# Patient Record
Sex: Female | Born: 1945 | Race: White | Hispanic: No | Marital: Married | Smoking: Former smoker
Health system: Southern US, Community
[De-identification: ages and names within clinical notes are randomized; demographics above are authoritative.]

## PROBLEM LIST (undated history)

## (undated) DIAGNOSIS — F329 Major depressive disorder, single episode, unspecified: Secondary | ICD-10-CM

## (undated) DIAGNOSIS — F32A Depression, unspecified: Secondary | ICD-10-CM

## (undated) DIAGNOSIS — E0590 Thyrotoxicosis, unspecified without thyrotoxic crisis or storm: Secondary | ICD-10-CM

## (undated) DIAGNOSIS — I10 Essential (primary) hypertension: Secondary | ICD-10-CM

## (undated) DIAGNOSIS — M1990 Unspecified osteoarthritis, unspecified site: Secondary | ICD-10-CM

## (undated) DIAGNOSIS — R51 Headache: Secondary | ICD-10-CM

## (undated) DIAGNOSIS — Z8719 Personal history of other diseases of the digestive system: Secondary | ICD-10-CM

## (undated) DIAGNOSIS — K219 Gastro-esophageal reflux disease without esophagitis: Secondary | ICD-10-CM

## (undated) DIAGNOSIS — R519 Headache, unspecified: Secondary | ICD-10-CM

## (undated) DIAGNOSIS — D649 Anemia, unspecified: Secondary | ICD-10-CM

## (undated) HISTORY — PX: BREAST SURGERY: SHX581

## (undated) HISTORY — PX: FOOT SURGERY: SHX648

## (undated) HISTORY — PX: ABDOMINAL HYSTERECTOMY: SHX81

## (undated) HISTORY — PX: OTHER SURGICAL HISTORY: SHX169

## (undated) HISTORY — PX: JOINT REPLACEMENT: SHX530

## (undated) HISTORY — PX: BACK SURGERY: SHX140

---

## 2015-01-20 ENCOUNTER — Other Ambulatory Visit: Payer: Self-pay | Admitting: Internal Medicine

## 2015-01-20 DIAGNOSIS — Z1231 Encounter for screening mammogram for malignant neoplasm of breast: Secondary | ICD-10-CM

## 2015-02-05 ENCOUNTER — Ambulatory Visit: Payer: Self-pay

## 2015-02-17 ENCOUNTER — Ambulatory Visit: Payer: Self-pay

## 2015-03-08 ENCOUNTER — Ambulatory Visit: Payer: Self-pay

## 2015-03-16 ENCOUNTER — Ambulatory Visit: Payer: Self-pay

## 2015-05-25 ENCOUNTER — Other Ambulatory Visit: Payer: Self-pay | Admitting: Pain Medicine

## 2015-05-25 DIAGNOSIS — M545 Low back pain: Secondary | ICD-10-CM

## 2015-08-13 ENCOUNTER — Ambulatory Visit
Admission: RE | Admit: 2015-08-13 | Discharge: 2015-08-13 | Disposition: A | Discharge status: Home / Self Care | Payer: Medicare Other | Source: Ambulatory Visit | Attending: Anesthesiology | Admitting: Anesthesiology

## 2015-08-13 ENCOUNTER — Other Ambulatory Visit: Payer: Self-pay | Admitting: Anesthesiology

## 2015-08-13 DIAGNOSIS — M5489 Other dorsalgia: Secondary | ICD-10-CM

## 2016-01-25 ENCOUNTER — Encounter (HOSPITAL_COMMUNITY): Payer: Self-pay | Admitting: Emergency Medicine

## 2016-01-25 ENCOUNTER — Emergency Department (HOSPITAL_COMMUNITY)
Admission: EM | Admit: 2016-01-25 | Discharge: 2016-01-25 | Disposition: A | Discharge status: Home / Self Care | Payer: Medicare Other | Attending: Emergency Medicine | Admitting: Emergency Medicine

## 2016-01-25 DIAGNOSIS — R55 Syncope and collapse: Secondary | ICD-10-CM | POA: Diagnosis not present

## 2016-01-25 DIAGNOSIS — R61 Generalized hyperhidrosis: Secondary | ICD-10-CM | POA: Diagnosis not present

## 2016-01-25 DIAGNOSIS — F17210 Nicotine dependence, cigarettes, uncomplicated: Secondary | ICD-10-CM | POA: Insufficient documentation

## 2016-01-25 DIAGNOSIS — R51 Headache: Secondary | ICD-10-CM | POA: Insufficient documentation

## 2016-01-25 DIAGNOSIS — K5900 Constipation, unspecified: Secondary | ICD-10-CM | POA: Insufficient documentation

## 2016-01-25 DIAGNOSIS — D649 Anemia, unspecified: Secondary | ICD-10-CM | POA: Insufficient documentation

## 2016-01-25 DIAGNOSIS — Z880 Allergy status to penicillin: Secondary | ICD-10-CM | POA: Diagnosis not present

## 2016-01-25 DIAGNOSIS — R42 Dizziness and giddiness: Secondary | ICD-10-CM | POA: Diagnosis present

## 2016-01-25 DIAGNOSIS — R0981 Nasal congestion: Secondary | ICD-10-CM | POA: Insufficient documentation

## 2016-01-25 DIAGNOSIS — R000 Tachycardia, unspecified: Secondary | ICD-10-CM | POA: Diagnosis not present

## 2016-01-25 DIAGNOSIS — I10 Essential (primary) hypertension: Secondary | ICD-10-CM | POA: Insufficient documentation

## 2016-01-25 DIAGNOSIS — G8929 Other chronic pain: Secondary | ICD-10-CM | POA: Insufficient documentation

## 2016-01-25 HISTORY — DX: Essential (primary) hypertension: I10

## 2016-01-25 LAB — BASIC METABOLIC PANEL
ANION GAP: 12 (ref 5–15)
BUN: 34 mg/dL — AB (ref 6–20)
CALCIUM: 9.1 mg/dL (ref 8.9–10.3)
CO2: 23 mmol/L (ref 22–32)
Chloride: 106 mmol/L (ref 101–111)
Creatinine, Ser: 0.81 mg/dL (ref 0.44–1.00)
GFR calc Af Amer: >60 mL/min (ref >60)
Glucose, Bld: 103 mg/dL — ABNORMAL HIGH (ref 65–99)
POTASSIUM: 4.8 mmol/L (ref 3.5–5.1)
SODIUM: 141 mmol/L (ref 135–145)

## 2016-01-25 LAB — CBC WITH DIFFERENTIAL/PLATELET
BASOS ABS: 0.1 10*3/uL (ref 0.0–0.1)
BASOS PCT: 1 %
EOS ABS: 0.1 10*3/uL (ref 0.0–0.7)
EOS PCT: 1 %
HCT: 32 % — ABNORMAL LOW (ref 36.0–46.0)
Hemoglobin: 10.3 g/dL — ABNORMAL LOW (ref 12.0–15.0)
LYMPHS PCT: 7 %
Lymphs Abs: 1.1 10*3/uL (ref 0.7–4.0)
MCH: 32 pg (ref 26.0–34.0)
MCHC: 32.2 g/dL (ref 30.0–36.0)
MCV: 99.4 fL (ref 78.0–100.0)
Monocytes Absolute: 1.1 10*3/uL — ABNORMAL HIGH (ref 0.1–1.0)
Monocytes Relative: 7 %
Neutro Abs: 12.7 10*3/uL — ABNORMAL HIGH (ref 1.7–7.7)
Neutrophils Relative %: 84 %
PLATELETS: 320 10*3/uL (ref 150–400)
RBC: 3.22 MIL/uL — AB (ref 3.87–5.11)
RDW: 13.1 % (ref 11.5–15.5)
WBC: 15.1 10*3/uL — AB (ref 4.0–10.5)

## 2016-01-25 MED ORDER — SODIUM CHLORIDE 0.9 % IV BOLUS (SEPSIS)
1000.0000 mL | Freq: Once | INTRAVENOUS | Status: AC
  Administered 2016-01-25: 1000 mL via INTRAVENOUS

## 2016-01-25 MED ORDER — SODIUM CHLORIDE 0.9 % IV BOLUS (SEPSIS): 1000.0000 mL | Freq: Once | INTRAVENOUS | Status: DC

## 2016-01-25 NOTE — ED Notes (Signed)
Pt verbalized understanding of d/c instructions and has no further questions. Pt stable and NAD.  

## 2016-01-25 NOTE — ED Notes (Signed)
Pt given [place] sandwich and water and encouraged to eat/drink.

## 2016-01-25 NOTE — ED Provider Notes (Signed)
CSN: [id number]     Arrival date & time 01/25/16  1454 History   First MD Initiated Contact with Patient 01/25/16 1516     Chief Complaint  Patient presents with  . Dizziness  . Hypotension     (Consider location/radiation/quality/duration/timing/severity/associated sxs/prior Treatment) HPI  70 year old female presents with lightheadedness. EMS reports that she was hypotensive on their arrival with blood pressure in the 90s and was diaphoretic and pale. Given 400 mL IV fluids with good response. Patient states that she was somewhat nauseated last night and then this morning when she woke up at 10 AM she was lightheaded and dizzy. Feeling like she's can a pass out. It was worse whenever she stood up or walked around but still present whenever lying flat, although better. She feels like after the fluids the lightheadedness at rest is much better. She states that for several hours she cannot seem to get rid of the lightheadedness despite drinking water. Patient endorses a history of hypertension and chronic pain treated with opiates. Daily new medicine recently was Ambien which she started last night. Previous to this she had not used Ambien for sleep for over 4 months. Some nausea but no vomiting. Had a headache this morning but states that she typically has headaches in the morning which has been attributed to sinus. Was not worse than typical. Some blurry vision while lightheaded but not now. No chest pain, neck pain, abdominal pain, vomiting, diarrhea, or shortness of breath.  Past Medical History  Diagnosis Date  . Hypertension    History reviewed. No pertinent past surgical history. No family history on file. Social History  Substance Use Topics  . Smoking status: Current Every Day Smoker -- 0.50 packs/day    Types: Cigarettes  . Smokeless tobacco: Current User  . Alcohol Use: 0.6 oz/week    1 Glasses of wine per week     Comment: once/month   OB History    No data available      Review of Systems  Constitutional: Positive for diaphoresis. Negative for fever.  HENT: Positive for congestion (chronic sinus problems) and sinus pressure.   Respiratory: Negative for shortness of breath.   Cardiovascular: Negative for chest pain.  Gastrointestinal: Positive for constipation (chronic). Negative for nausea, vomiting, abdominal pain and diarrhea.  Genitourinary: Negative for dysuria.  Neurological: Positive for light-headedness and headaches (chronic, minimal now). Negative for syncope.  All other systems reviewed and are negative.     Allergies  Ampicillin  Home Medications   Prior to Admission medications   Not on File   BP 151/79 mmHg  Pulse 91  SpO2 98% Physical Exam  Constitutional: She is oriented to person, place, and time. She appears well-developed and well-nourished.  HENT:  Head: Normocephalic and atraumatic.  Right Ear: External ear normal.  Left Ear: External ear normal.  Nose: Nose normal.  Eyes: EOM are normal. Pupils are equal, round, and reactive to light. Right eye exhibits no discharge. Left eye exhibits no discharge.  Neck: Normal range of motion. Neck supple.  No stiffness/meningismus  Cardiovascular: Regular rhythm and normal heart sounds.  Tachycardia present.   Pulmonary/Chest: Effort normal and breath sounds normal. She has no wheezes. She has no rales.  Abdominal: Soft. She exhibits no distension. There is no tenderness.  Musculoskeletal: She exhibits no edema.  Neurological: She is alert and oriented to person, place, and time.  CN 2-12 grossly intact. 5/5 strength in all 4 extremities. Normal finger to nose. Grossly normal sensation  Skin: Skin is warm and dry.  Nursing note and vitals reviewed.   ED Course  Procedures (including critical care time) Labs Review Labs Reviewed  BASIC METABOLIC PANEL - Abnormal; Notable for the following:    Glucose, Bld 103 (*)    BUN 34 (*)    All other components within normal limits   CBC WITH DIFFERENTIAL/PLATELET - Abnormal; Notable for the following:    WBC 15.1 (*)    RBC 3.22 (*)    Hemoglobin 10.3 (*)    HCT 32.0 (*)    Neutro Abs 12.7 (*)    Monocytes Absolute 1.1 (*)    All other components within normal limits    Imaging Review No results found. I have personally reviewed and evaluated these images and lab results as part of my medical decision-making.   EKG Interpretation   Date/Time:  Tuesday January 25 2016 16:26:20 EDT Ventricular Rate:  94 PR Interval:  136 QRS Duration: 83 QT Interval:  342 QTC Calculation: 428 R Axis:   63 Text Interpretation:  Sinus rhythm Normal ECG Artifact No old tracing to  compare Confirmed by Aqua Denslow  MD, Isaih Bulger ([id number]) on 01/25/2016 4:30:45 PM  Also confirmed by Criss Alvine  MD, Sheccid Lahmann ([id number]), editor WALKER, CCT, SANDRA  ([id number])  on 01/25/2016 4:33:43 PM      MDM   Final diagnoses:  Near syncope  Anemia, unspecified anemia type    Unclear exactly why patient is feeling lightheaded. Neuro exam is unremarkable. Possibly related to recent addition of Ambien to her medicines. Could also be from dehydration. She feels much better with IV fluids here. She is anemic with a hemoglobin of 10 but no baseline. She states if she takes Pepto-Bismol for abdominal pain she will have black stools for a couple days but none recently. She declines rectal exam. At this point given that she feels completely normal and her vital signs are unremarkable I think it is reasonable to discharge her. She wants to go home. Increase fluid intake at home and follow-up with PCP for recheck of her hemoglobin. Discussed return precautions.    Pricilla Loveless, MD 01/25/16 [phone]

## 2016-01-25 NOTE — ED Notes (Signed)
[facility] with cc of dizziness and hypotension. Pt reports nausea last night and dizziness that began at 10am today. When EMS arrived, pt was diaphoretic and pale, SBP 90's. EMS gave 400cc's NS bolus, BG 88. Pt awake, a&ox4, denies pain or sob.

## 2016-01-25 NOTE — ED Notes (Addendum)
Pt states "I feel better and pt able to sit up without nausea and dizziness:" VSS. Pt stable and NAD. Pt d/c home with husband driving. Pt verbalized understanding of d/c instructions and has no further questions.

## 2017-01-08 ENCOUNTER — Ambulatory Visit (INDEPENDENT_AMBULATORY_CARE_PROVIDER_SITE_OTHER): Payer: Medicare Other | Admitting: Orthopaedic Surgery

## 2017-01-08 ENCOUNTER — Ambulatory Visit (INDEPENDENT_AMBULATORY_CARE_PROVIDER_SITE_OTHER): Payer: Medicare Other

## 2017-01-08 DIAGNOSIS — M1611 Unilateral primary osteoarthritis, right hip: Secondary | ICD-10-CM | POA: Diagnosis not present

## 2017-01-08 DIAGNOSIS — M25551 Pain in right hip: Secondary | ICD-10-CM

## 2017-01-08 NOTE — Progress Notes (Signed)
Office Visit Note   Patient: Kaitlin Little           Date of Birth: Dec 28, 1945           MRN: [id number] Visit Date: 01/08/2017              Requested by: No referring provider defined for this encounter. PCP: No primary care provider on file.   Assessment & Plan: Visit Diagnoses:  1. Pain in right hip   2. Unilateral primary osteoarthritis, right hip     Plan: Given the severity of her right hip pain as well as her clinical exam and x-ray findings show severe osteoarthritis and generally disease we are recommending a right total hip replacement through direct anterior approach. She is familiar with this having had this done her left side before. She understands the risk of acute blood loss anemia, nerve and vessel injury, fracture, infection, dislocation and DVT. She understands our goals are decreased pain improve mobility and overall poor quality of life. We had a long and thorough discussion about the surgery and she does wish to proceed in the near future. We see her back in 2 weeks postoperative no x-rays of be needed. All questions were encouraged and answered area  Follow-Up Instructions: Return for 2 weeks post-op.   Orders:  Orders Placed This Encounter  Procedures  . XR HIP UNILAT W OR W/O PELVIS 1V RIGHT   No orders of the defined types were placed in this encounter.     Procedures: No procedures performed   Clinical Data: No additional findings.   Subjective: No chief complaint on file. The patient comes in with a chief complaint of right hip pain. Is been getting worse for about 2-3 years now. She has had a history of a left hip replacement done down in [place]. This was done through a direct anterior approach. She like to have this done for her known osteoarthritis of her right hip. Her pain is 10 out of 10. It is detrimentally affected her activities daily living, her quality of life, her mobility. She is on chronic pain medications and in pain management due  to severe back issues. She's had previous lumbar spine surgery at L4/L5. The pain is constant. It started on the lateral aspect of her hip but now hurts in the groin area on the right side. Walking causes her worse pain.  HPI  Review of Systems She currently denies any headache, shortness of breath, chest pain, nausea, vomiting, fever, chills.  Objective: Vital Signs: There were no vitals taken for this visit.  Physical Exam She is alert and oriented 3 and in no acute distress Ortho Exam Examination of her right hip shows pain with internal and external rotation as well as abduction. She has pain over trochanteric area and in the groin. It is quite severe. Her leg lengths are near equal. Specialty Comments:  No specialty comments available.  Imaging: Xr Hip Unilat W Or W/o Pelvis 1v Right  Result Date: 01/08/2017 An AP pelvis and lateral of her right hip show severe osteoarthritis and degenerative joint disease of the right hip. There is a trigger osteophytes, joint space narrowing, cystic and sclerotic changes in the femoral head and sclerotic changes in the acetabulum. There is a hip replacement on the left side as well seated. She has remote healed left greater trochanteric fracture.    PMFS History: Patient Active Problem List   Diagnosis Date Noted  . Pain in right hip 01/08/2017  .  Unilateral primary osteoarthritis, right hip 01/08/2017   Past Medical History:  Diagnosis Date  . Hypertension     No family history on file.  No past surgical history on file. Social History   Occupational History  . Not on file.   Social History Main Topics  . Smoking status: Current Every Day Smoker    Packs/day: 0.50    Types: Cigarettes  . Smokeless tobacco: Current User  . Alcohol use 0.6 oz/week    1 Glasses of wine per week     Comment: once/month  . Drug use: Unknown  . Sexual activity: Not on file

## 2017-04-10 NOTE — Progress Notes (Signed)
Please place orders in EPIC as patient is being scheduled for a pre-op appointment! Thank you! 

## 2017-04-12 ENCOUNTER — Other Ambulatory Visit (INDEPENDENT_AMBULATORY_CARE_PROVIDER_SITE_OTHER): Payer: Self-pay | Admitting: Orthopaedic Surgery

## 2017-04-16 NOTE — Progress Notes (Signed)
Please place orders in EPIC as patient has a pre-op appointment on 04/20/2017! Thank you! 

## 2017-04-17 NOTE — Progress Notes (Signed)
Please place orders in EPIC as patient has a pre-op appointment on 04/20/2017! Thank you! 

## 2017-04-18 ENCOUNTER — Other Ambulatory Visit (INDEPENDENT_AMBULATORY_CARE_PROVIDER_SITE_OTHER): Payer: Self-pay | Admitting: Physician Assistant

## 2017-04-19 NOTE — Patient Instructions (Signed)
Kaitlin Little  04/19/2017   Your procedure is scheduled on: 04/27/17  Report to [facility] Main  Entrance Take Staley  elevators to 3rd floor to  Short Stay Center at     [phone].    Call this number if you have problems the morning of surgery [phone]   Remember: ONLY 1 PERSON MAY GO WITH YOU TO SHORT STAY TO GET  READY MORNING OF YOUR SURGERY.  Do not eat food or drink liquids :After Midnight.     Take these medicines the morning of surgery with A SIP OF WATER:  Nexium, cymbalta, amlodipine                                You may not have any metal on your body including hair pins and              piercings  Do not wear jewelry, make-up, lotions, powders or perfumes, deodorant             Do not wear nail polish.  Do not shave  48 hours prior to surgery.               Do not bring valuables to the hospital. [facility] IS NOT             RESPONSIBLE   FOR VALUABLES.  Contacts, dentures or bridgework may not be worn into surgery.  Leave suitcase in the car. After surgery it may be brought to your room.               Please read over the following fact sheets you were given: _____________________________________________________________________ [name] - Preparing for Surgery Before surgery, you can play an important role.  Because skin is not sterile, your skin needs to be as free of germs as possible.  You can reduce the number of germs on your skin by washing with CHG (chlorahexidine gluconate) soap before surgery.  CHG is an antiseptic cleaner which kills germs and bonds with the skin to continue killing germs even after washing. Please DO NOT use if you have an allergy to CHG or antibacterial soaps.  If your skin becomes reddened/irritated stop using the CHG and inform your nurse when you arrive at Short Stay. Do not shave (including legs and underarms) for at least 48 hours prior to the first CHG shower.  You may shave your  face/neck. Please follow these instructions carefully:  1.  Shower with CHG Soap the night before surgery and the  morning of Surgery.  2.  If you choose to wash your hair, wash your hair first as usual with your  normal  shampoo.  3.  After you shampoo, rinse your hair and body thoroughly to remove the  shampoo.                           4.  Use CHG as you would any other liquid soap.  You can apply chg directly  to the skin and wash                       Gently with a scrungie or clean washcloth.  5.  Apply the CHG Soap to your body ONLY  FROM THE NECK DOWN.   Do not use on face/ open                           Wound or open sores. Avoid contact with eyes, ears mouth and genitals (private parts).                       Wash face,  Genitals (private parts) with your normal soap.             6.  Wash thoroughly, paying special attention to the area where your surgery  will be performed.  7.  Thoroughly rinse your body with warm water from the neck down.  8.  DO NOT shower/wash with your normal soap after using and rinsing off  the CHG Soap.                9.  Pat yourself dry with a clean towel.            10.  Wear clean pajamas.            11.  Place clean sheets on your bed the night of your first shower and do not  sleep with pets. Day of Surgery : Do not apply any lotions/deodorants the morning of surgery.  Please wear clean clothes to the hospital/surgery center.  FAILURE TO FOLLOW THESE INSTRUCTIONS MAY RESULT IN THE CANCELLATION OF YOUR SURGERY PATIENT SIGNATURE_________________________________  NURSE SIGNATURE__________________________________  ________________________________________________________________________   Adam Phenix  An incentive spirometer is a tool that can help keep your lungs clear and active. This tool measures how well you are filling your lungs with each breath. Taking long deep breaths may help reverse or decrease the chance of developing breathing  (pulmonary) problems (especially infection) following:  A long period of time when you are unable to move or be active. BEFORE THE PROCEDURE   If the spirometer includes an indicator to show your best effort, your nurse or respiratory therapist will set it to a desired goal.  If possible, sit up straight or lean slightly forward. Try not to slouch.  Hold the incentive spirometer in an upright position. INSTRUCTIONS FOR USE  1. Sit on the edge of your bed if possible, or sit up as far as you can in bed or on a chair. 2. Hold the incentive spirometer in an upright position. 3. Breathe out normally. 4. Place the mouthpiece in your mouth and seal your lips tightly around it. 5. Breathe in slowly and as deeply as possible, raising the piston or the ball toward the top of the column. 6. Hold your breath for 3-5 seconds or for as long as possible. Allow the piston or ball to fall to the bottom of the column. 7. Remove the mouthpiece from your mouth and breathe out normally. 8. Rest for a few seconds and repeat Steps 1 through 7 at least 10 times every 1-2 hours when you are awake. Take your time and take a few normal breaths between deep breaths. 9. The spirometer may include an indicator to show your best effort. Use the indicator as a goal to work toward during each repetition. 10. After each set of 10 deep breaths, practice coughing to be sure your lungs are clear. If you have an incision (the cut made at the time of surgery), support your incision when coughing by placing a pillow or rolled up towels firmly against it. Once you  are able to get out of bed, walk around indoors and cough well. You may stop using the incentive spirometer when instructed by your caregiver.  RISKS AND COMPLICATIONS  Take your time so you do not get dizzy or light-headed.  If you are in pain, you may need to take or ask for pain medication before doing incentive spirometry. It is harder to take a deep breath if you  are having pain. AFTER USE  Rest and breathe slowly and easily.  It can be helpful to keep track of a log of your progress. Your caregiver can provide you with a simple table to help with this. If you are using the spirometer at home, follow these instructions: [place] IF:   You are having difficultly using the spirometer.  You have trouble using the spirometer as often as instructed.  Your pain medication is not giving enough relief while using the spirometer.  You develop fever of 100.5 F (38.1 C) or higher. SEEK IMMEDIATE MEDICAL CARE IF:   You cough up bloody sputum that had not been present before.  You develop fever of 102 F (38.9 C) or greater.  You develop worsening pain at or near the incision site. MAKE SURE YOU:   Understand these instructions.  Will watch your condition.  Will get help right away if you are not doing well or get worse. Document Released: 01/29/2007 Document Revised: 12/11/2011 Document Reviewed: 04/01/2007 ExitCare Patient Information 2014 ExitCare, [place].   ________________________________________________________________________  WHAT IS A BLOOD TRANSFUSION? Blood Transfusion Information  A transfusion is the replacement of blood or some of its parts. Blood is made up of multiple cells which provide different functions.  Red blood cells carry oxygen and are used for blood loss replacement.  White blood cells fight against infection.  Platelets control bleeding.  Plasma helps clot blood.  Other blood products are available for specialized needs, such as hemophilia or other clotting disorders. BEFORE THE TRANSFUSION  Who gives blood for transfusions?   Healthy volunteers who are fully evaluated to make sure their blood is safe. This is blood bank blood. Transfusion therapy is the safest it has ever been in the practice of medicine. Before blood is taken from a donor, a complete history is taken to make sure that person has  no history of diseases nor engages in risky social behavior (examples are intravenous drug use or sexual activity with multiple partners). The donor's travel history is screened to minimize risk of transmitting infections, such as malaria. The donated blood is tested for signs of infectious diseases, such as HIV and hepatitis. The blood is then tested to be sure it is compatible with you in order to minimize the chance of a transfusion reaction. If you or a relative donates blood, this is often done in anticipation of surgery and is not appropriate for emergency situations. It takes many days to process the donated blood. RISKS AND COMPLICATIONS Although transfusion therapy is very safe and saves many lives, the main dangers of transfusion include:   Getting an infectious disease.  Developing a transfusion reaction. This is an allergic reaction to something in the blood you were given. Every precaution is taken to prevent this. The decision to have a blood transfusion has been considered carefully by your caregiver before blood is given. Blood is not given unless the benefits outweigh the risks. AFTER THE TRANSFUSION  Right after receiving a blood transfusion, you will usually feel much better and more energetic. This is  especially true if your red blood cells have gotten low (anemic). The transfusion raises the level of the red blood cells which carry oxygen, and this usually causes an energy increase.  The nurse administering the transfusion will monitor you carefully for complications. HOME CARE INSTRUCTIONS  No special instructions are needed after a transfusion. You may find your energy is better. Speak with your caregiver about any limitations on activity for underlying diseases you may have. SEEK MEDICAL CARE IF:   Your condition is not improving after your transfusion.  You develop redness or irritation at the intravenous (IV) site. SEEK IMMEDIATE MEDICAL CARE IF:  Any of the following  symptoms occur over the next 12 hours:  Shaking chills.  You have a temperature by mouth above 102 F (38.9 C), not controlled by medicine.  Chest, back, or muscle pain.  People around you feel you are not acting correctly or are confused.  Shortness of breath or difficulty breathing.  Dizziness and fainting.  You get a rash or develop hives.  You have a decrease in urine output.  Your urine turns a dark color or changes to pink, red, or brown. Any of the following symptoms occur over the next 10 days:  You have a temperature by mouth above 102 F (38.9 C), not controlled by medicine.  Shortness of breath.  Weakness after normal activity.  The white part of the eye turns yellow (jaundice).  You have a decrease in the amount of urine or are urinating less often.  Your urine turns a dark color or changes to pink, red, or brown. Document Released: 09/15/2000 Document Revised: 12/11/2011 Document Reviewed: 05/04/2008 [facility] Patient Information 2014 [place], [place].  _______________________________________________________________________

## 2017-04-20 ENCOUNTER — Encounter (INDEPENDENT_AMBULATORY_CARE_PROVIDER_SITE_OTHER): Payer: Self-pay

## 2017-04-20 ENCOUNTER — Encounter (HOSPITAL_COMMUNITY)
Admission: RE | Admit: 2017-04-20 | Discharge: 2017-04-20 | Disposition: A | Discharge status: Home / Self Care | Payer: Medicare Other | Source: Ambulatory Visit | Attending: Orthopaedic Surgery | Admitting: Orthopaedic Surgery

## 2017-04-20 ENCOUNTER — Encounter (HOSPITAL_COMMUNITY): Payer: Self-pay

## 2017-04-20 DIAGNOSIS — Z01810 Encounter for preprocedural cardiovascular examination: Secondary | ICD-10-CM | POA: Insufficient documentation

## 2017-04-20 DIAGNOSIS — Z01812 Encounter for preprocedural laboratory examination: Secondary | ICD-10-CM | POA: Insufficient documentation

## 2017-04-20 DIAGNOSIS — M1611 Unilateral primary osteoarthritis, right hip: Secondary | ICD-10-CM | POA: Diagnosis not present

## 2017-04-20 HISTORY — DX: Depression, unspecified: F32.A

## 2017-04-20 HISTORY — DX: Personal history of other diseases of the digestive system: Z87.19

## 2017-04-20 HISTORY — DX: Unspecified osteoarthritis, unspecified site: M19.90

## 2017-04-20 HISTORY — DX: Gastro-esophageal reflux disease without esophagitis: K21.9

## 2017-04-20 HISTORY — DX: Headache: R51

## 2017-04-20 HISTORY — DX: Thyrotoxicosis, unspecified without thyrotoxic crisis or storm: E05.90

## 2017-04-20 HISTORY — DX: Headache, unspecified: R51.9

## 2017-04-20 HISTORY — DX: Major depressive disorder, single episode, unspecified: F32.9

## 2017-04-20 HISTORY — DX: Anemia, unspecified: D64.9

## 2017-04-20 LAB — CBC
HCT: 35.4 % — ABNORMAL LOW (ref 36.0–46.0)
HEMOGLOBIN: 11.5 g/dL — AB (ref 12.0–15.0)
MCH: 29.6 pg (ref 26.0–34.0)
MCHC: 32.5 g/dL (ref 30.0–36.0)
MCV: 91.2 fL (ref 78.0–100.0)
Platelets: 330 10*3/uL (ref 150–400)
RBC: 3.88 MIL/uL (ref 3.87–5.11)
RDW: 14.5 % (ref 11.5–15.5)
WBC: 6.4 10*3/uL (ref 4.0–10.5)

## 2017-04-20 LAB — BASIC METABOLIC PANEL
Anion gap: 11 (ref 5–15)
BUN: 20 mg/dL (ref 6–20)
CALCIUM: 9.6 mg/dL (ref 8.9–10.3)
CO2: 28 mmol/L (ref 22–32)
Chloride: 97 mmol/L — ABNORMAL LOW (ref 101–111)
Creatinine, Ser: 0.94 mg/dL (ref 0.44–1.00)
GFR calc non Af Amer: >60 mL/min (ref >60)
GLUCOSE: 104 mg/dL — AB (ref 65–99)
POTASSIUM: 4.7 mmol/L (ref 3.5–5.1)
SODIUM: 136 mmol/L (ref 135–145)

## 2017-04-20 LAB — SURGICAL PCR SCREEN
MRSA, PCR: NEGATIVE
STAPHYLOCOCCUS AUREUS: POSITIVE — AB

## 2017-04-20 LAB — ABO/RH: ABO/RH(D): A POS

## 2017-04-27 ENCOUNTER — Inpatient Hospital Stay (HOSPITAL_COMMUNITY): Payer: Medicare Other | Admitting: Anesthesiology

## 2017-04-27 ENCOUNTER — Encounter (HOSPITAL_COMMUNITY): Payer: Self-pay

## 2017-04-27 ENCOUNTER — Inpatient Hospital Stay (HOSPITAL_COMMUNITY)
Admission: RE | Admit: 2017-04-27 | Discharge: 2017-04-30 | DRG: 470 | Disposition: A | Discharge status: Home Health | Payer: Medicare Other | Source: Ambulatory Visit | Attending: Orthopaedic Surgery | Admitting: Orthopaedic Surgery

## 2017-04-27 ENCOUNTER — Inpatient Hospital Stay (HOSPITAL_COMMUNITY): Payer: Medicare Other

## 2017-04-27 ENCOUNTER — Encounter (HOSPITAL_COMMUNITY)
Admission: RE | Disposition: A | Discharge status: Home Health | Payer: Self-pay | Source: Ambulatory Visit | Attending: Orthopaedic Surgery

## 2017-04-27 DIAGNOSIS — Z91040 Latex allergy status: Secondary | ICD-10-CM | POA: Diagnosis not present

## 2017-04-27 DIAGNOSIS — Z888 Allergy status to other drugs, medicaments and biological substances status: Secondary | ICD-10-CM

## 2017-04-27 DIAGNOSIS — M1611 Unilateral primary osteoarthritis, right hip: Principal | ICD-10-CM | POA: Diagnosis present

## 2017-04-27 DIAGNOSIS — K219 Gastro-esophageal reflux disease without esophagitis: Secondary | ICD-10-CM | POA: Diagnosis present

## 2017-04-27 DIAGNOSIS — Z87891 Personal history of nicotine dependence: Secondary | ICD-10-CM

## 2017-04-27 DIAGNOSIS — Z96641 Presence of right artificial hip joint: Secondary | ICD-10-CM

## 2017-04-27 DIAGNOSIS — Z419 Encounter for procedure for purposes other than remedying health state, unspecified: Secondary | ICD-10-CM

## 2017-04-27 DIAGNOSIS — F329 Major depressive disorder, single episode, unspecified: Secondary | ICD-10-CM | POA: Diagnosis present

## 2017-04-27 DIAGNOSIS — D62 Acute posthemorrhagic anemia: Secondary | ICD-10-CM | POA: Diagnosis not present

## 2017-04-27 DIAGNOSIS — I10 Essential (primary) hypertension: Secondary | ICD-10-CM | POA: Diagnosis present

## 2017-04-27 DIAGNOSIS — Z91048 Other nonmedicinal substance allergy status: Secondary | ICD-10-CM

## 2017-04-27 DIAGNOSIS — M25551 Pain in right hip: Secondary | ICD-10-CM | POA: Diagnosis present

## 2017-04-27 DIAGNOSIS — Z79899 Other long term (current) drug therapy: Secondary | ICD-10-CM | POA: Diagnosis not present

## 2017-04-27 HISTORY — PX: TOTAL HIP ARTHROPLASTY: SHX124

## 2017-04-27 SURGERY — ARTHROPLASTY, HIP, TOTAL, ANTERIOR APPROACH
Anesthesia: Spinal | Site: Hip | Laterality: Right

## 2017-04-27 MED ORDER — PROPOFOL 500 MG/50ML IV EMUL
INTRAVENOUS | Status: DC | PRN
  Administered 2017-04-27: 100 ug/kg/min via INTRAVENOUS

## 2017-04-27 MED ORDER — FENTANYL CITRATE (PF) 100 MCG/2ML IJ SOLN
INTRAMUSCULAR | Status: DC | PRN
  Administered 2017-04-27: 100 ug via INTRAVENOUS

## 2017-04-27 MED ORDER — PROPOFOL 10 MG/ML IV BOLUS
INTRAVENOUS | Status: AC
  Filled 2017-04-27: qty 20

## 2017-04-27 MED ORDER — VITAMIN D3 25 MCG (1000 UNIT) PO TABS
2000.0000 [IU] | ORAL_TABLET | Freq: Every day | ORAL | Status: DC
  Administered 2017-04-27 – 2017-04-30 (×4): 2000 [IU] via ORAL
  Filled 2017-04-27 (×5): qty 2

## 2017-04-27 MED ORDER — LACTATED RINGERS IV SOLN
INTRAVENOUS | Status: DC | PRN
  Administered 2017-04-27 (×2): via INTRAVENOUS

## 2017-04-27 MED ORDER — SODIUM CHLORIDE 0.9 % IV SOLN
INTRAVENOUS | Status: DC
  Administered 2017-04-27 – 2017-04-28 (×2): via INTRAVENOUS

## 2017-04-27 MED ORDER — MIDAZOLAM HCL 5 MG/5ML IJ SOLN
INTRAMUSCULAR | Status: DC | PRN
  Administered 2017-04-27 (×2): 2 mg via INTRAVENOUS

## 2017-04-27 MED ORDER — STERILE WATER FOR IRRIGATION IR SOLN
Status: DC | PRN
  Administered 2017-04-27: 2000 mL

## 2017-04-27 MED ORDER — MENTHOL 3 MG MT LOZG: 1.0000 | LOZENGE | OROMUCOSAL | Status: DC | PRN

## 2017-04-27 MED ORDER — HYDROMORPHONE HCL-NACL 0.5-0.9 MG/ML-% IV SOSY
1.0000 mg | PREFILLED_SYRINGE | INTRAVENOUS | Status: DC | PRN
  Administered 2017-04-27 – 2017-04-28 (×5): 1 mg via INTRAVENOUS
  Filled 2017-04-27 (×5): qty 2

## 2017-04-27 MED ORDER — CLINDAMYCIN PHOSPHATE 900 MG/50ML IV SOLN
900.0000 mg | INTRAVENOUS | Status: AC
  Administered 2017-04-27: 900 mg via INTRAVENOUS
  Filled 2017-04-27: qty 50

## 2017-04-27 MED ORDER — CHLORHEXIDINE GLUCONATE 4 % EX LIQD: 60.0000 mL | Freq: Once | CUTANEOUS | Status: DC

## 2017-04-27 MED ORDER — ONDANSETRON HCL 4 MG/2ML IJ SOLN
INTRAMUSCULAR | Status: DC | PRN
  Administered 2017-04-27: 4 mg via INTRAVENOUS

## 2017-04-27 MED ORDER — PROPOFOL 10 MG/ML IV BOLUS
INTRAVENOUS | Status: AC
  Filled 2017-04-27: qty 40

## 2017-04-27 MED ORDER — DULOXETINE HCL 60 MG PO CPEP
60.0000 mg | ORAL_CAPSULE | Freq: Every day | ORAL | Status: DC
  Administered 2017-04-28 – 2017-04-30 (×3): 60 mg via ORAL
  Filled 2017-04-27 (×3): qty 1

## 2017-04-27 MED ORDER — METOCLOPRAMIDE HCL 5 MG PO TABS
5.0000 mg | ORAL_TABLET | Freq: Three times a day (TID) | ORAL | Status: DC | PRN
  Administered 2017-04-27: 10 mg via ORAL
  Filled 2017-04-27: qty 2

## 2017-04-27 MED ORDER — PHENYLEPHRINE 40 MCG/ML (10ML) SYRINGE FOR IV PUSH (FOR BLOOD PRESSURE SUPPORT)
PREFILLED_SYRINGE | INTRAVENOUS | Status: DC | PRN
  Administered 2017-04-27: 80 ug via INTRAVENOUS
  Administered 2017-04-27 (×2): 40 ug via INTRAVENOUS
  Administered 2017-04-27 (×5): 80 ug via INTRAVENOUS

## 2017-04-27 MED ORDER — ACETAMINOPHEN 325 MG PO TABS
650.0000 mg | ORAL_TABLET | Freq: Four times a day (QID) | ORAL | Status: DC | PRN
  Administered 2017-04-27 – 2017-04-29 (×2): 650 mg via ORAL
  Filled 2017-04-27 (×2): qty 2

## 2017-04-27 MED ORDER — 0.9 % SODIUM CHLORIDE (POUR BTL) OPTIME
TOPICAL | Status: DC | PRN
  Administered 2017-04-27: 1000 mL

## 2017-04-27 MED ORDER — SIMVASTATIN 10 MG PO TABS
10.0000 mg | ORAL_TABLET | Freq: Every evening | ORAL | Status: DC
  Administered 2017-04-27 – 2017-04-29 (×3): 10 mg via ORAL
  Filled 2017-04-27 (×3): qty 1

## 2017-04-27 MED ORDER — ONDANSETRON HCL 4 MG/2ML IJ SOLN
4.0000 mg | Freq: Four times a day (QID) | INTRAMUSCULAR | Status: DC | PRN
  Administered 2017-04-27: 4 mg via INTRAVENOUS
  Filled 2017-04-27 (×2): qty 2

## 2017-04-27 MED ORDER — ZOLPIDEM TARTRATE 5 MG PO TABS
5.0000 mg | ORAL_TABLET | Freq: Every evening | ORAL | Status: DC | PRN
  Administered 2017-04-28 – 2017-04-29 (×2): 5 mg via ORAL
  Filled 2017-04-27 (×2): qty 1

## 2017-04-27 MED ORDER — DOCUSATE SODIUM 100 MG PO CAPS
100.0000 mg | ORAL_CAPSULE | Freq: Two times a day (BID) | ORAL | Status: DC
  Administered 2017-04-27 – 2017-04-30 (×6): 100 mg via ORAL
  Filled 2017-04-27 (×6): qty 1

## 2017-04-27 MED ORDER — OXYCODONE HCL 5 MG PO TABS
5.0000 mg | ORAL_TABLET | ORAL | Status: DC | PRN
  Administered 2017-04-27: 11:00:00 5 mg via ORAL
  Administered 2017-04-27: 15 mg via ORAL
  Administered 2017-04-27: 11:00:00 10 mg via ORAL
  Administered 2017-04-27 (×2): 15 mg via ORAL
  Administered 2017-04-28: 10 mg via ORAL
  Administered 2017-04-28 (×2): 15 mg via ORAL
  Administered 2017-04-28: 18:00:00 10 mg via ORAL
  Administered 2017-04-29 – 2017-04-30 (×10): 15 mg via ORAL
  Filled 2017-04-27 (×2): qty 3
  Filled 2017-04-27 (×3): qty 1
  Filled 2017-04-27 (×11): qty 3
  Filled 2017-04-27: qty 2
  Filled 2017-04-27 (×3): qty 3

## 2017-04-27 MED ORDER — ACETAMINOPHEN 650 MG RE SUPP: 650.0000 mg | Freq: Four times a day (QID) | RECTAL | Status: DC | PRN

## 2017-04-27 MED ORDER — BUPIVACAINE HCL (PF) 0.5 % IJ SOLN
INTRAMUSCULAR | Status: AC
  Filled 2017-04-27: qty 30

## 2017-04-27 MED ORDER — OXYCODONE HCL 5 MG/5ML PO SOLN: 5.0000 mg | Freq: Once | ORAL | Status: DC | PRN

## 2017-04-27 MED ORDER — SODIUM CHLORIDE 0.9 % IR SOLN
Status: DC | PRN
  Administered 2017-04-27: 1000 mL

## 2017-04-27 MED ORDER — TRANEXAMIC ACID 1000 MG/10ML IV SOLN
1000.0000 mg | INTRAVENOUS | Status: AC
  Administered 2017-04-27: 1000 mg via INTRAVENOUS
  Filled 2017-04-27: qty 1100

## 2017-04-27 MED ORDER — ONDANSETRON HCL 4 MG/2ML IJ SOLN
INTRAMUSCULAR | Status: AC
  Filled 2017-04-27: qty 2

## 2017-04-27 MED ORDER — CLINDAMYCIN PHOSPHATE 600 MG/50ML IV SOLN
600.0000 mg | Freq: Four times a day (QID) | INTRAVENOUS | Status: AC
  Administered 2017-04-27 (×2): 600 mg via INTRAVENOUS
  Filled 2017-04-27 (×2): qty 50

## 2017-04-27 MED ORDER — OXYCODONE HCL 5 MG PO TABS: 5.0000 mg | ORAL_TABLET | Freq: Once | ORAL | Status: DC | PRN

## 2017-04-27 MED ORDER — BISACODYL 10 MG RE SUPP: 10.0000 mg | Freq: Every day | RECTAL | Status: DC | PRN

## 2017-04-27 MED ORDER — DEXAMETHASONE SODIUM PHOSPHATE 10 MG/ML IJ SOLN
INTRAMUSCULAR | Status: DC | PRN
  Administered 2017-04-27: 10 mg via INTRAVENOUS

## 2017-04-27 MED ORDER — PHENOL 1.4 % MT LIQD: 1.0000 | OROMUCOSAL | Status: DC | PRN

## 2017-04-27 MED ORDER — METOCLOPRAMIDE HCL 5 MG/ML IJ SOLN: 5.0000 mg | Freq: Three times a day (TID) | INTRAMUSCULAR | Status: DC | PRN

## 2017-04-27 MED ORDER — COENZYME Q10 200 MG PO CAPS: 200.0000 mg | ORAL_CAPSULE | Freq: Every day | ORAL | Status: DC

## 2017-04-27 MED ORDER — MIDAZOLAM HCL 2 MG/2ML IJ SOLN
INTRAMUSCULAR | Status: AC
  Filled 2017-04-27: qty 2

## 2017-04-27 MED ORDER — BUPIVACAINE HCL (PF) 0.5 % IJ SOLN
INTRAMUSCULAR | Status: DC | PRN
  Administered 2017-04-27: 15 mg via INTRATHECAL

## 2017-04-27 MED ORDER — GABAPENTIN 100 MG PO CAPS
100.0000 mg | ORAL_CAPSULE | Freq: Three times a day (TID) | ORAL | Status: DC
  Administered 2017-04-27 – 2017-04-30 (×9): 100 mg via ORAL
  Filled 2017-04-27 (×9): qty 1

## 2017-04-27 MED ORDER — HYDROMORPHONE HCL-NACL 0.5-0.9 MG/ML-% IV SOSY: 0.2500 mg | PREFILLED_SYRINGE | INTRAVENOUS | Status: DC | PRN

## 2017-04-27 MED ORDER — DEXAMETHASONE SODIUM PHOSPHATE 10 MG/ML IJ SOLN
INTRAMUSCULAR | Status: AC
  Filled 2017-04-27: qty 1

## 2017-04-27 MED ORDER — PHENYLEPHRINE 40 MCG/ML (10ML) SYRINGE FOR IV PUSH (FOR BLOOD PRESSURE SUPPORT)
PREFILLED_SYRINGE | INTRAVENOUS | Status: AC
  Filled 2017-04-27: qty 10

## 2017-04-27 MED ORDER — PANTOPRAZOLE SODIUM 40 MG PO TBEC
80.0000 mg | DELAYED_RELEASE_TABLET | Freq: Every day | ORAL | Status: DC
  Administered 2017-04-27 – 2017-04-30 (×3): 80 mg via ORAL
  Filled 2017-04-27 (×3): qty 2

## 2017-04-27 MED ORDER — PROMETHAZINE HCL 25 MG/ML IJ SOLN: 6.2500 mg | INTRAMUSCULAR | Status: DC | PRN

## 2017-04-27 MED ORDER — FENTANYL CITRATE (PF) 100 MCG/2ML IJ SOLN
INTRAMUSCULAR | Status: AC
  Filled 2017-04-27: qty 2

## 2017-04-27 MED ORDER — METHOCARBAMOL 1000 MG/10ML IJ SOLN
500.0000 mg | Freq: Four times a day (QID) | INTRAVENOUS | Status: DC | PRN
  Administered 2017-04-27: 500 mg via INTRAVENOUS
  Filled 2017-04-27: qty 550

## 2017-04-27 MED ORDER — ADULT MULTIVITAMIN W/MINERALS CH
1.0000 | ORAL_TABLET | Freq: Every day | ORAL | Status: DC
  Administered 2017-04-27 – 2017-04-30 (×4): 1 via ORAL
  Filled 2017-04-27 (×4): qty 1

## 2017-04-27 MED ORDER — ASPIRIN 81 MG PO CHEW
81.0000 mg | CHEWABLE_TABLET | Freq: Two times a day (BID) | ORAL | Status: DC
  Administered 2017-04-27 – 2017-04-30 (×6): 81 mg via ORAL
  Filled 2017-04-27 (×6): qty 1

## 2017-04-27 MED ORDER — AMLODIPINE BESYLATE 5 MG PO TABS
5.0000 mg | ORAL_TABLET | Freq: Every day | ORAL | Status: DC
  Administered 2017-04-28 – 2017-04-30 (×3): 5 mg via ORAL
  Filled 2017-04-27 (×3): qty 1

## 2017-04-27 MED ORDER — ONDANSETRON HCL 4 MG PO TABS
4.0000 mg | ORAL_TABLET | Freq: Four times a day (QID) | ORAL | Status: DC | PRN
  Administered 2017-04-29: 13:00:00 4 mg via ORAL
  Filled 2017-04-27: qty 1

## 2017-04-27 MED ORDER — DIPHENHYDRAMINE HCL 12.5 MG/5ML PO ELIX: 12.5000 mg | ORAL_SOLUTION | ORAL | Status: DC | PRN

## 2017-04-27 MED ORDER — POLYETHYLENE GLYCOL 3350 17 G PO PACK: 17.0000 g | PACK | Freq: Every day | ORAL | Status: DC | PRN

## 2017-04-27 MED ORDER — METHOCARBAMOL 500 MG PO TABS
500.0000 mg | ORAL_TABLET | Freq: Four times a day (QID) | ORAL | Status: DC | PRN
  Administered 2017-04-27 – 2017-04-30 (×7): 500 mg via ORAL
  Filled 2017-04-27 (×7): qty 1

## 2017-04-27 SURGICAL SUPPLY — 45 items
BAG ZIPLOCK 12X15 (MISCELLANEOUS) ×3 IMPLANT
BENZOIN TINCTURE PRP APPL 2/3 (GAUZE/BANDAGES/DRESSINGS) ×3 IMPLANT
BLADE SAW SGTL 18X1.27X75 (BLADE) ×2 IMPLANT
BLADE SAW SGTL 18X1.27X75MM (BLADE) ×1
CAPT HIP TOTAL 2 ×3 IMPLANT
CELLS DAT CNTRL 66122 CELL SVR (MISCELLANEOUS) ×1 IMPLANT
CLOSURE WOUND 1/2 X4 (GAUZE/BANDAGES/DRESSINGS) ×1
COVER PERINEAL POST (MISCELLANEOUS) ×3 IMPLANT
COVER SURGICAL LIGHT HANDLE (MISCELLANEOUS) ×3 IMPLANT
DRAPE STERI IOBAN 125X83 (DRAPES) ×3 IMPLANT
DRAPE U-SHAPE 47X51 STRL (DRAPES) ×6 IMPLANT
DRESSING AQUACEL AG SP 3.5X10 (GAUZE/BANDAGES/DRESSINGS) ×1 IMPLANT
DRSG AQUACEL AG ADV 3.5X10 (GAUZE/BANDAGES/DRESSINGS) ×3 IMPLANT
DRSG AQUACEL AG SP 3.5X10 (GAUZE/BANDAGES/DRESSINGS) ×3
DURAPREP 26ML APPLICATOR (WOUND CARE) ×3 IMPLANT
ELECT REM PT RETURN 15FT ADLT (MISCELLANEOUS) ×3 IMPLANT
GAUZE XEROFORM 1X8 LF (GAUZE/BANDAGES/DRESSINGS) IMPLANT
GLOVE BIO SURGEON STRL SZ7.5 (GLOVE) ×3 IMPLANT
GLOVE BIOGEL PI IND STRL 6.5 (GLOVE) ×2 IMPLANT
GLOVE BIOGEL PI IND STRL 7.5 (GLOVE) ×4 IMPLANT
GLOVE BIOGEL PI IND STRL 8 (GLOVE) ×2 IMPLANT
GLOVE BIOGEL PI INDICATOR 6.5 (GLOVE) ×4
GLOVE BIOGEL PI INDICATOR 7.5 (GLOVE) ×8
GLOVE BIOGEL PI INDICATOR 8 (GLOVE) ×4
GLOVE ECLIPSE 8.0 STRL XLNG CF (GLOVE) ×3 IMPLANT
GLOVE SURG SS PI 6.5 STRL IVOR (GLOVE) ×6 IMPLANT
GLOVE SURG SS PI 7.5 STRL IVOR (GLOVE) ×6 IMPLANT
GOWN STRL REUS W/ TWL XL LVL3 (GOWN DISPOSABLE) ×1 IMPLANT
GOWN STRL REUS W/TWL LRG LVL3 (GOWN DISPOSABLE) ×6 IMPLANT
GOWN STRL REUS W/TWL XL LVL3 (GOWN DISPOSABLE) ×8 IMPLANT
HANDPIECE INTERPULSE COAX TIP (DISPOSABLE) ×2
HOLDER FOLEY CATH W/STRAP (MISCELLANEOUS) ×3 IMPLANT
PACK ANTERIOR HIP CUSTOM (KITS) ×3 IMPLANT
RTRCTR WOUND ALEXIS 18CM MED (MISCELLANEOUS) ×3
SET HNDPC FAN SPRY TIP SCT (DISPOSABLE) ×1 IMPLANT
STAPLER VISISTAT 35W (STAPLE) IMPLANT
STRIP CLOSURE SKIN 1/2X4 (GAUZE/BANDAGES/DRESSINGS) ×2 IMPLANT
SUT ETHIBOND NAB CT1 #1 30IN (SUTURE) ×3 IMPLANT
SUT MNCRL AB 4-0 PS2 18 (SUTURE) IMPLANT
SUT VIC AB 0 CT1 36 (SUTURE) ×3 IMPLANT
SUT VIC AB 1 CT1 36 (SUTURE) ×3 IMPLANT
SUT VIC AB 2-0 CT1 27 (SUTURE) ×4
SUT VIC AB 2-0 CT1 TAPERPNT 27 (SUTURE) ×2 IMPLANT
TRAY FOLEY CATH SILVER 16FR LF (SET/KITS/TRAYS/PACK) ×3 IMPLANT
YANKAUER SUCT BULB TIP 10FT TU (MISCELLANEOUS) ×3 IMPLANT

## 2017-04-27 NOTE — Transfer of Care (Signed)
Immediate Anesthesia Transfer of Care Note  Patient: Kaitlin Little  Procedure(s) Performed: Procedure(s): RIGHT TOTAL HIP ARTHROPLASTY ANTERIOR APPROACH (Right)  Patient Location: PACU  Anesthesia Type:MAC and Spinal  Level of Consciousness: sedated  Airway & Oxygen Therapy: Patient Spontanous Breathing and Patient connected to face mask oxygen  Post-op Assessment: Report given to RN and Post -op Vital signs reviewed and stable  Post vital signs: Reviewed and stable  Last Vitals:  Vitals:   04/27/17 0533  BP: (!) 145/71  Pulse: 83  Resp: 18  Temp: 36.8 C    Last Pain:  Vitals:   04/27/17 0554  TempSrc:   PainSc: 5       Patients Stated Pain Goal: 4 (11/57/26 2035)  Complications: No apparent anesthesia complications

## 2017-04-27 NOTE — Evaluation (Signed)
Physical Therapy Evaluation Patient Details Name: Kaitlin GriffinsSarah Axtell MRN: [id number] DOB: March 03, 1946 Today's Date: 04/27/2017   History of Present Illness  Pt s/p R THR and with hx of L THR ~4 yrs ago  Clinical Impression  Pt s/p R THR and presents with decreased R LE strength/ROM and post op pain limiting functional mobility.  Pt should progress to dc home with family assist.    Follow Up Recommendations Home health PT    Equipment Recommendations  None recommended by PT    Recommendations for Other Services OT consult     Precautions / Restrictions Precautions Precautions: Fall Restrictions Weight Bearing Restrictions: No Other Position/Activity Restrictions: WBAT      Mobility  Bed Mobility Overal bed mobility: Needs Assistance Bed Mobility: Sit to Supine;Supine to Sit     Supine to sit: Min assist Sit to supine: Min assist   General bed mobility comments: cues for sequence and use of L LE to self assist  Transfers Overall transfer level: Needs assistance Equipment used: Rolling walker (2 wheeled) Transfers: Sit to/from Stand Sit to Stand: Min assist         General transfer comment: cues for LE management and use of UEs to self assist  Ambulation/Gait Ambulation/Gait assistance: Min assist Ambulation Distance (Feet): 38 Feet Assistive device: Rolling walker (2 wheeled) Gait Pattern/deviations: Step-to pattern;Decreased step length - right;Decreased step length - left;Shuffle;Trunk flexed Gait velocity: decr Gait velocity interpretation: Below normal speed for age/gender General Gait Details: cues for posture, sequence and position from [employer]  Stairs            Wheelchair Mobility    Modified Rankin (Stroke Patients Only)       Balance Overall balance assessment: No apparent balance deficits (not formally assessed)                                           Pertinent Vitals/Pain Pain Assessment: 0-10 Pain Score: 6  Pain  Location: R hip Pain Descriptors / Indicators: Aching;Sore Pain Intervention(s): Limited activity within patient's tolerance;Monitored during session;Premedicated before session;Ice applied    Home Living Family/patient expects to be discharged to:: Private residence Living Arrangements: Spouse/significant other Available Help at Discharge: Family Type of Home: House Home Access: Level entry     Home Layout: One level Home Equipment: Environmental consultantWalker - 4 wheels;Cane - single point      Prior Function Level of Independence: Independent;Independent with assistive device(s)               Hand Dominance        Extremity/Trunk Assessment   Upper Extremity Assessment Upper Extremity Assessment: Overall WFL for tasks assessed    Lower Extremity Assessment Lower Extremity Assessment: RLE deficits/detail    Cervical / Trunk Assessment Cervical / Trunk Assessment: Normal  Communication   Communication: No difficulties  Cognition Arousal/Alertness: Awake/alert Behavior During Therapy: WFL for tasks assessed/performed Overall Cognitive Status: Within Functional Limits for tasks assessed                                        General Comments      Exercises Total Joint Exercises Ankle Circles/Pumps: AROM;Both;15 reps;Supine   Assessment/Plan    PT Assessment Patient needs continued PT services  PT Problem List Decreased strength;Decreased range of motion;Decreased  activity tolerance;Decreased mobility;Decreased knowledge of use of DME;Pain       PT Treatment Interventions DME instruction;Gait training;Functional mobility training;Therapeutic activities;Therapeutic exercise;Patient/family education    PT Goals (Current goals can be found in the Care Plan section)  Acute Rehab PT Goals Patient Stated Goal: Regain IND and walk without pain PT Goal Formulation: With patient Time For Goal Achievement: 05/01/17 Potential to Achieve Goals: Good    Frequency  7X/week   Barriers to discharge        Co-evaluation               AM-PAC PT "6 Clicks" Daily Activity  Outcome Measure Difficulty turning over in bed (including adjusting bedclothes, sheets and blankets)?: Total Difficulty moving from lying on back to sitting on the side of the bed? : Total Difficulty sitting down on and standing up from a chair with arms (e.g., wheelchair, bedside commode, etc,.)?: Total Help needed moving to and from a bed to chair (including a wheelchair)?: A Little Help needed walking in hospital room?: A Little Help needed climbing 3-5 steps with a railing? : A Little 6 Click Score: 12    End of Session Equipment Utilized During Treatment: Gait belt Activity Tolerance: Patient tolerated treatment well Patient left: in bed;with call bell/phone within reach Nurse Communication: Mobility status PT Visit Diagnosis: Difficulty in walking, not elsewhere classified (R26.2)    Time: [id number] PT Time Calculation (min) (ACUTE ONLY): 27 min   Charges:   PT Evaluation $PT Eval Low Complexity: 1 Procedure PT Treatments $Gait Training: 8-22 mins   PT G Codes:        Pg [phone]   Nicolena Schurman 04/27/2017, 3:26 PM

## 2017-04-27 NOTE — Anesthesia Postprocedure Evaluation (Signed)
Anesthesia Post Note  Patient: Ether GriffinsSarah Lutzke  Procedure(s) Performed: Procedure(s) (LRB): RIGHT TOTAL HIP ARTHROPLASTY ANTERIOR APPROACH (Right)     Patient location during evaluation: PACU Anesthesia Type: Spinal Level of consciousness: oriented and awake and alert Pain management: pain level controlled Vital Signs Assessment: post-procedure vital signs reviewed and stable Respiratory status: spontaneous breathing, respiratory function stable and patient connected to nasal cannula oxygen Cardiovascular status: blood pressure returned to baseline and stable Postop Assessment: no headache and no backache Anesthetic complications: no    Last Vitals:  Vitals:   04/27/17 1220 04/27/17 1337  BP: 133/62 (!) 150/67  Pulse: 79 79  Resp: 18 17  Temp: 37.2 C 36.5 C    Last Pain:  Vitals:   04/27/17 1337  TempSrc: Axillary  PainSc:                  Catheryn Baconyan P Raunak Antuna

## 2017-04-27 NOTE — Anesthesia Preprocedure Evaluation (Addendum)
Anesthesia Evaluation  Patient identified by MRN, date of birth, ID band Patient awake    Reviewed: Allergy & Precautions, NPO status , Patient's Chart, lab work & pertinent test results  Airway Mallampati: I  TM Distance: >3 FB Neck ROM: Full    Dental no notable dental hx.    Pulmonary former smoker,    Pulmonary exam normal breath sounds clear to auscultation       Cardiovascular hypertension, Pt. on medications Normal cardiovascular exam Rhythm:Regular Rate:Normal  ECG: NSR, rate 70   Neuro/Psych  Headaches, Depression    GI/Hepatic Neg liver ROS, hiatal hernia, GERD  Medicated and Controlled,  Endo/Other  Hyperthyroidism   Renal/GU negative Renal ROS     Musculoskeletal  (+) Arthritis , Osteoarthritis,    Abdominal   Peds  Hematology  (+) anemia ,   Anesthesia Other Findings Hyperlipidemia  Reproductive/Obstetrics                            Anesthesia Physical Anesthesia Plan  ASA: III  Anesthesia Plan: Spinal   Post-op Pain Management:    Induction: Intravenous  PONV Risk Score and Plan: 2 and Ondansetron, Dexamethasone, Propofol and Midazolam  Airway Management Planned:   Additional Equipment:   Intra-op Plan:   Post-operative Plan:   Informed Consent: I have reviewed the patients History and Physical, chart, labs and discussed the procedure including the risks, benefits and alternatives for the proposed anesthesia with the patient or authorized representative who has indicated his/her understanding and acceptance.   Dental advisory given  Plan Discussed with: CRNA  Anesthesia Plan Comments:         Anesthesia Quick Evaluation

## 2017-04-27 NOTE — Care Management Note (Deleted)
Case Management Note  Patient Details  Name: Kaitlin Little MRN: [id number] Date of Birth: 1945-10-22  Subjective/Objective:   71 y/o f admitted w/OA R hip. POD#1 R THA. From home. Has rw. May need 3n1.[facility] @ [facility]. Await PT cons-recc.                 Action/Plan:d/c home.   Expected Discharge Date:                  Expected Discharge Plan:  Home w Home Health Services  In-House Referral:     Discharge planning Services  CM Consult  Post Acute Care Choice:  Durable Medical Equipment (Has rw) Choice offered to:     DME Arranged:    DME Agency:     HH Arranged:    HH Agency:     Status of Service:  In process, will continue to follow  If discussed at Long Length of Stay Meetings, dates discussed:    Additional Comments:  Lanier ClamMahabir, Madysun Thall, RN 04/27/2017, 10:42 AM

## 2017-04-27 NOTE — Op Note (Signed)
NAMEther Griffins:  Kaitlin Little, Kaitlin Little                 ACCOUNT NO.:  [id number]  MEDICAL RECORD NO.:  [id number]  LOCATION:  [facility]                         [facility]  PHYSICIAN:  Vanita PandaChristopher Y. Magnus IvanBlackman, M.D.DATE OF BIRTH:  1946/05/15  DATE OF PROCEDURE:  04/27/2017 DATE OF DISCHARGE:                              OPERATIVE REPORT   PREOPERATIVE DIAGNOSES:  Primary osteoarthritis and degenerative joint disease, right hip.  POSTOPERATIVE DIAGNOSES:  Primary osteoarthritis and degenerative joint disease, right hip.  PROCEDURE:  Right total hip arthroplasty through direct anterior approach.  IMPLANTS:  DePuy Sector Gription acetabular component size 52, size 36+ 0 neutral polyethylene liner, size 12 Corail femoral component with varus offset, size 36 +1.5 ceramic hip ball.  SURGEON:  Kathryne Hitchhristopher Y Milos Milligan MD.  ASSISTANT:  RNFA, Lujean Amelhristy Neal  ANTIBIOTICS:  900 mg of IV clindamycin.  BLOOD LOSS:  400 mL.  COMPLICATIONS:  None.  INDICATIONS:  Kaitlin Little is a 71 year old female with debilitating arthritis involving her right hip.  She is status post a left total hip [place] about 4 years ago.  Her right hip has slowly gotten worse in terms of pain and now it is severe.  It is 10/10. It has detrimentally affected her activities of daily living, her quality of life, and her mobility.  At this point, she does wish to proceed with a total hip arthroplasty.  She understands the risks of acute blood loss anemia, nerve and vessel injury, fracture, infection, dislocation, and DVT.  She understands our goals are to decrease pain, improve mobility, and overall improve quality of life.  PROCEDURE DESCRIPTION:  After informed consent was obtained, appropriate right hip was marked.  She was brought to the operating room.  Spinal anesthesia was obtained while she was on her stretcher.  A Foley catheter was placed.  Then, traction boots were placed on both her feet. She was then  placed supine on the Hana fracture table with perineal post in place and both legs in inline skeletal traction devices, but no traction applied.  Of note, leg lengths are actually equal, but the radiographs show that she is much longer on her right side, but on exam she is equal, so the x-rays would reflect that.  PROCEDURE DESCRIPTION:  Informed consent was obtained, appropriate right hip was marked.  She was placed supine on the operating table.  After a spinal anesthesia was obtained as well, she was placed supine on the Hana fracture table with the perineal post in place and both legs in inline skeletal traction, but no traction applied.  Her right operative hip was prepped and draped with DuraPrep and sterile drapes.  Time-out was called and she was identified as correct patient and correct right hip.  We then made an incision just inferior and posterior to the anterior superior iliac spine and carried this obliquely down the leg. We dissected down tensor fascia lata muscle and the tensor fascia was then divided longitudinally to proceed with a direct anterior approach to the hip.  We identified and cauterized the circumflex vessels and then identified the hip capsule.  I opened the hip capsule in an L-type format, finding a moderate  joint effusion and significant periarticular osteophytes.  I placed a corkscrew guide in the femoral head and removed the femoral head in its entirety and found to be devoid of cartilage. We then placed a bent Hohmann over the medial acetabular rim and removed remnants of the acetabular labrum.  We then began reaming from a size 43 reamer in stepwise increments up to a size 52 to correlate with her other side.  We then placed the last reamer under direct fluoroscopy, so we could obtain our depth of reaming, our inclination, and anteversion. Once I was pleased with this, I placed the real DePuy sector Gription acetabular component size 52 and a 36+ 0  neutral polyethylene liner for a size acetabular component.  Attention was then turned to the femur. With the leg externally rotated to 120 degrees extended and adducted, we were able to use a Mueller retractor medially and a Hohmann retractor behind the greater trochanter.  We released the lateral joint capsule and used a box cutting osteotome, the inner femoral canal, and a rongeur to lateralize.  We then began broaching from a size 8 broach using the Corail broaching system going up to a size 12 with a size 12 in place, we trialed a varus offset femoral neck based on her anatomy from the opposite side and a 36 +1.5 hip ball, brought the leg back over and up with traction and rotation.  It was definitely tight, but very stable. We assessed the leg lengths and we went back and forth between preoperative and post implant films and felt like we were close on her leg lengths.  It was definitely stable on rotation.  We then dislocated the hip and removed the trial components.  We placed the real Corail femoral component with varus offset size 12 and the real 36 +1.5 ceramic hip ball and reduced this acetabulum again.  We were pleased with leg length, offset and stability.  We then irrigated the soft tissue with normal saline solution using pulsatile lavage.  We closed the joint capsule with interrupted #1 Ethibond suture followed by running #1 Vicryl in tensor fascia, 0 Vicryl in the deep tissue, 2-0 Vicryl in the subcutaneous tissue, 4-0 Monocryl subcuticular stitch and Steri-Strips on the skin.  An Aquacel dressing was applied.  She was taken to recovery room in stable condition.  All final counts were correct. There were no complications noted.     Vanita Pandahristopher Y. Magnus IvanBlackman, M.D.     CYB/MEDQ  D:  04/27/2017  T:  04/27/2017  Job:  [id number]

## 2017-04-27 NOTE — Brief Op Note (Signed)
04/27/2017  9:02 AM  PATIENT:  Kaitlin Little  71 y.o. female  PRE-OPERATIVE DIAGNOSIS:  osteoarthritis right hip  POST-OPERATIVE DIAGNOSIS:  osteoarthritis right hip  PROCEDURE:  Procedure(s): RIGHT TOTAL HIP ARTHROPLASTY ANTERIOR APPROACH (Right)  SURGEON:  Surgeon(s) and Role:    Kathryne Hitch* Aliviya Schoeller Y, MD - Primary   ASSISTANTS: Lujean Amelhristy Neal, RNFA   ANESTHESIA:   spinal  EBL:  Total I/O In: 1900 [I.V.:1900] Out: 450 [Urine:50; Blood:400]  COUNTS:  YES  DICTATION: .Other Dictation: Dictation Number [id number]  PLAN OF CARE: Admit to inpatient   PATIENT DISPOSITION:  PACU - hemodynamically stable.   Delay start of Pharmacological VTE agent (>24hrs) due to surgical blood loss or risk of bleeding: no

## 2017-04-27 NOTE — Progress Notes (Signed)
PHARMACIST - PHYSICIAN ORDER COMMUNICATION  CONCERNING: P&T Medication Policy on Herbal Medications  DESCRIPTION:  This patient's order for: Coenzyme Q-10 has been noted.  This product(s) is classified as an "herbal" or natural product. Due to a lack of definitive safety studies or FDA approval, nonstandard manufacturing practices, plus the potential risk of unknown drug-drug interactions while on inpatient medications, the Pharmacy and Therapeutics Committee does not permit the use of "herbal" or natural products of this type within [facility].   ACTION TAKEN: The pharmacy department is unable to verify this order at this time and your patient has been informed of this safety policy. Please reevaluate patient's clinical condition at discharge and address if the herbal or natural product(s) should be resumed at that time.   Greer PickerelJigna Tamaka Sawin, PharmD, BCPS Pager: [phone] 04/27/2017 10:10 AM

## 2017-04-27 NOTE — Anesthesia Procedure Notes (Signed)
Spinal  Start time: 04/27/2017 7:24 AM End time: 04/27/2017 7:26 AM Staffing Resident/CRNA: Harle Stanford R Performed: resident/CRNA  Preanesthetic Checklist Completed: patient identified, site marked, surgical consent, pre-op evaluation, timeout performed, IV checked, risks and benefits discussed and monitors and equipment checked Spinal Block Patient position: sitting Prep: Betadine Patient monitoring: heart rate, cardiac monitor, blood pressure and continuous pulse ox Approach: midline Location: L3-4 Injection technique: single-shot Needle Needle type: Pencan  Needle gauge: 24 G Needle length: 10 cm Needle insertion depth: 7 cm Assessment Sensory level: T6 Additional Notes Timeout performed, SAB kit date checked. SAB without difficulty

## 2017-04-27 NOTE — H&P (Signed)
TOTAL HIP ADMISSION H&P  Patient is admitted for right total hip arthroplasty.  Subjective:  Chief Complaint: right hip pain  HPI: Kaitlin Little, 71 y.o. female, has a history of pain and functional disability in the right hip(s) due to arthritis and patient has failed non-surgical conservative treatments for greater than 12 weeks to include NSAID's and/or analgesics, corticosteriod injections, use of assistive devices and activity modification.  Onset of symptoms was gradual starting 2 years ago with gradually worsening course since that time.The patient noted no past surgery on the right hip(s).  Patient currently rates pain in the right hip at 10 out of 10 with activity. Patient has night pain, worsening of pain with activity and weight bearing, pain that interfers with activities of daily living and pain with passive range of motion. Patient has evidence of subchondral sclerosis, periarticular osteophytes and joint space narrowing by imaging studies. This condition presents safety issues increasing the risk of falls.  There is no current active infection.  Patient Active Problem List   Diagnosis Date Noted  . Pain in right hip 01/08/2017  . Unilateral primary osteoarthritis, right hip 01/08/2017   Past Medical History:  Diagnosis Date  . Anemia    bleeding ulcers no blood transfusions  . Arthritis   . Depression   . GERD (gastroesophageal reflux disease)   . Headache    sinus headaches  . History of hiatal hernia   . Hypertension   . Hyperthyroidism    benign nodules in neck no meds    Past Surgical History:  Procedure Laterality Date  . ABDOMINAL HYSTERECTOMY     partial  . BACK SURGERY     lower  . BREAST SURGERY     lumpectomy bil breast  . FOOT SURGERY     bil bunions and straightening great toe  . JOINT REPLACEMENT     Left hip right hip 04/27/17 Dr. Salena Saner [place]  . knee arthroscopy     left    Prescriptions Prior to Admission  Medication Sig Dispense Refill Last Dose   . amLODipine (NORVASC) 5 MG tablet Take 5 mg by mouth daily.  2 04/27/2017 at 0430  . Biotin 10 MG CAPS Take 10 mg by mouth 2 (two) times a week.    04/25/2017 at Unknown time  . Cholecalciferol (VITAMIN D) 2000 units CAPS Take 2,000 Units by mouth daily.   04/26/2017 at Unknown time  . Coenzyme Q10 200 MG capsule Take 200 mg by mouth daily.   04/26/2017 at Unknown time  . DULoxetine (CYMBALTA) 60 MG capsule Take 60 mg by mouth daily.  2 04/27/2017 at 0430  . esomeprazole (NEXIUM) 40 MG capsule Take 40 mg by mouth daily before breakfast.  2 04/23/2017 at Unknown time  . MOVANTIK 25 MG TABS tablet Take 25 mg by mouth daily.  3 04/25/2017 at Unknown time  . Multiple Vitamin (MULTIVITAMIN WITH MINERALS) TABS tablet Take 1 tablet by mouth daily.   04/26/2017 at Unknown time  . oxyCODONE-acetaminophen (PERCOCET) 10-325 MG tablet Take 1 tablet by mouth 3 (three) times daily.  0 04/26/2017 at Unknown time  . simvastatin (ZOCOR) 10 MG tablet Take 10 mg by mouth every evening.   0 04/26/2017 at Unknown time  . zolpidem (AMBIEN) 10 MG tablet Take 10 mg by mouth at bedtime as needed. For sleep.  2 04/26/2017 at Unknown time  . Menthol (COOL N HEAT/BACK) 5 % [facility] Place 1 patch onto the skin daily as needed (for pain.).  More than a month at Unknown time   Allergies  Allergen Reactions  . Adhesive [Tape] Other (See Comments)    Skin tears/redness. Cloth/paper tape tolerates  . Ampicillin Hives and Diarrhea    TOLERATES ORALLY NOT INTRAVENOUS  Has patient had a PCN reaction causing immediate rash, facial/tongue/throat swelling, SOB or lightheadedness with hypotension:No Has patient had a PCN reaction causing severe rash involving mucus membranes or skin necrosis:Unknown Has patient had a PCN reaction that required hospitalization:Inpatient when reaction occurred. Has patient had a PCN reaction occurring within the last 10 years: No If all of the above answers are "NO", then may proceed with Cephalosporin  . Latex  Other (See Comments)    Skin redness/peels skin    Social History  Substance Use Topics  . Smoking status: Former Smoker    Packs/day: 0.50    Types: Cigarettes  . Smokeless tobacco: Never Used     Comment: 40 years  . Alcohol use 0.6 oz/week    1 Glasses of wine per week     Comment: 4x /month    History reviewed. No pertinent family history.   Review of Systems  Musculoskeletal: Positive for joint pain.  All other systems reviewed and are negative.   Objective:  Physical Exam  Constitutional: She is oriented to person, place, and time. She appears well-developed and well-nourished.  HENT:  Head: Normocephalic and atraumatic.  Eyes: Pupils are equal, round, and reactive to light. EOM are normal.  Neck: Normal range of motion. Neck supple.  Cardiovascular: Normal rate and regular rhythm.   Respiratory: Effort normal and breath sounds normal.  GI: Soft. Bowel sounds are normal.  Musculoskeletal:       Right hip: She exhibits decreased range of motion, decreased strength, tenderness and bony tenderness.  Neurological: She is alert and oriented to person, place, and time.  Skin: Skin is warm and dry.  Psychiatric: She has a normal mood and affect.    Vital signs in last 24 hours: Temp:  [98.2 F (36.8 C)] 98.2 F (36.8 C) (07/27 0533) Pulse Rate:  [83] 83 (07/27 0533) Resp:  [18] 18 (07/27 0533) BP: (145)/(71) 145/71 (07/27 0533) SpO2:  [99 %] 99 % (07/27 0533) Weight:  [134 lb (60.8 kg)] 134 lb (60.8 kg) (07/27 0554)  Labs:   Estimated body mass index is 23 kg/m as calculated from the following:   Height as of this encounter: 5\' 4"  (1.626 m).   Weight as of this encounter: 134 lb (60.8 kg).   Imaging Review Plain radiographs demonstrate severe degenerative joint disease of the right hip(s). The bone quality appears to be excellent for age and reported activity level.  Assessment/Plan:  End stage arthritis, right hip(s)  The patient history, physical  examination, clinical judgement of the provider and imaging studies are consistent with end stage degenerative joint disease of the right hip(s) and total hip arthroplasty is deemed medically necessary. The treatment options including medical management, injection therapy, arthroscopy and arthroplasty were discussed at length. The risks and benefits of total hip arthroplasty were presented and reviewed. The risks due to aseptic loosening, infection, stiffness, dislocation/subluxation,  thromboembolic complications and other imponderables were discussed.  The patient acknowledged the explanation, agreed to proceed with the plan and consent was signed. Patient is being admitted for inpatient treatment for surgery, pain control, PT, OT, prophylactic antibiotics, VTE prophylaxis, progressive ambulation and ADL's and discharge planning.The patient is planning to be discharged home with home health services

## 2017-04-27 NOTE — Care Management Note (Signed)
Case Management Note  Patient Details  Name: Kaitlin Little MRN: [id number] Date of Birth: Mar 11, 1946  Subjective/Objective: 71 y/o f admitted w/OA R Hip. From home. S/p R THA. Has rw, May need 3n1. [facility] rep Jorja Loaim already following prior to admission. Await PT/OT-recc.                 Action/Plan:d/c home w/HHC.   Expected Discharge Date:                  Expected Discharge Plan:  Home w Home Health Services  In-House Referral:     Discharge planning Services  CM Consult  Post Acute Care Choice:  Durable Medical Equipment (Has rw) Choice offered to:     DME Arranged:    DME Agency:     HH Arranged:    HH Agency:     Status of Service:  In process, will continue to follow  If discussed at Long Length of Stay Meetings, dates discussed:    Additional Comments:  Lanier ClamMahabir, Serafin [place], RN 04/27/2017, 11:16 AM

## 2017-04-28 LAB — BASIC METABOLIC PANEL
Anion gap: 6 (ref 5–15)
BUN: 14 mg/dL (ref 6–20)
CALCIUM: 8.6 mg/dL — AB (ref 8.9–10.3)
CO2: 28 mmol/L (ref 22–32)
CREATININE: 0.8 mg/dL (ref 0.44–1.00)
Chloride: 102 mmol/L (ref 101–111)
Glucose, Bld: 145 mg/dL — ABNORMAL HIGH (ref 65–99)
Potassium: 4.5 mmol/L (ref 3.5–5.1)
SODIUM: 136 mmol/L (ref 135–145)

## 2017-04-28 LAB — CBC
HCT: 22.1 % — ABNORMAL LOW (ref 36.0–46.0)
HEMOGLOBIN: 7.5 g/dL — AB (ref 12.0–15.0)
MCH: 30.6 pg (ref 26.0–34.0)
MCHC: 33.9 g/dL (ref 30.0–36.0)
MCV: 90.2 fL (ref 78.0–100.0)
Platelets: 218 10*3/uL (ref 150–400)
RBC: 2.45 MIL/uL — ABNORMAL LOW (ref 3.87–5.11)
RDW: 14.6 % (ref 11.5–15.5)
WBC: 10.2 10*3/uL (ref 4.0–10.5)

## 2017-04-28 MED ORDER — FAMOTIDINE 20 MG PO TABS
40.0000 mg | ORAL_TABLET | Freq: Once | ORAL | Status: AC
  Administered 2017-04-28: 13:00:00 40 mg via ORAL
  Filled 2017-04-28: qty 2

## 2017-04-28 NOTE — Progress Notes (Signed)
Physical Therapy Treatment Patient Details Name: Kaitlin GriffinsSarah Dial MRN: [id number] DOB: 05/31/1946 Today's Date: 04/28/2017    History of Present Illness Pt s/p R THR and with hx of L THR ~4 yrs ago    PT Comments    Pt motivated and with marked improvement in pain control and activity tolerance this am.   Follow Up Recommendations  Home health PT     Equipment Recommendations  Rolling walker with 5" wheels    Recommendations for Other Services OT consult     Precautions / Restrictions Precautions Precautions: Fall Restrictions Weight Bearing Restrictions: No Other Position/Activity Restrictions: WBAT    Mobility  Bed Mobility Overal bed mobility: Needs Assistance Bed Mobility: Supine to Sit     Supine to sit: Min assist     General bed mobility comments: cues for sequence  and use of L LE to self assist  Transfers Overall transfer level: Needs assistance Equipment used: Rolling walker (2 wheeled) Transfers: Sit to/from Stand Sit to Stand: Min guard         General transfer comment: cues for UE/LE placement  Ambulation/Gait Ambulation/Gait assistance: Min assist;Min guard Ambulation Distance (Feet): 140 Feet Assistive device: Rolling walker (2 wheeled) Gait Pattern/deviations: Step-to pattern;Step-through pattern;Decreased step length - right;Decreased step length - left;Shuffle;Trunk flexed Gait velocity: decr Gait velocity interpretation: Below normal speed for age/gender General Gait Details: cues for posture, sequence and position from Roh[employer]W   Stairs            Wheelchair Mobility    Modified Rankin (Stroke Patients Only)       Balance Overall balance assessment: No apparent balance deficits (not formally assessed)                                          Cognition Arousal/Alertness: Awake/alert Behavior During Therapy: WFL for tasks assessed/performed Overall Cognitive Status: Within Functional Limits for tasks assessed                                        Exercises Total Joint Exercises Ankle Circles/Pumps: AROM;Both;15 reps;Supine Quad Sets: AROM;Both;10 reps;Supine Heel Slides: AAROM;Right;15 reps;Supine Hip ABduction/ADduction: AAROM;Right;10 reps;Supine    General Comments        Pertinent Vitals/Pain Pain Assessment: 0-10 Pain Score: 3  Pain Location: R hip Pain Descriptors / Indicators: Sore Pain Intervention(s): Limited activity within patient's tolerance;Monitored during session;Premedicated before session;Ice applied    Home Living Family/patient expects to be discharged to:: Private residence Living Arrangements: Spouse/significant other             Additional Comments: BOUGHT AN ETS BUT WOULD RATHER HAVE 3:1    Prior Function Level of Independence: Independent;Independent with assistive device(s)          PT Goals (current goals can now be found in the care plan section) Acute Rehab PT Goals Patient Stated Goal: Regain IND and walk without pain PT Goal Formulation: With patient Time For Goal Achievement: 05/01/17 Potential to Achieve Goals: Good Progress towards PT goals: Progressing toward goals    Frequency    7X/week      PT Plan Current plan remains appropriate    Co-evaluation              AM-PAC PT "6 Clicks" Daily Activity  Outcome Measure  Difficulty  turning over in bed (including adjusting bedclothes, sheets and blankets)?: Total Difficulty moving from lying on back to sitting on the side of the bed? : Total Difficulty sitting down on and standing up from a chair with arms (e.g., wheelchair, bedside commode, etc,.)?: Total Help needed moving to and from a bed to chair (including a wheelchair)?: A Little Help needed walking in hospital room?: A Little Help needed climbing 3-5 steps with a railing? : A Little 6 Click Score: 12    End of Session Equipment Utilized During Treatment: Gait belt Activity Tolerance: Patient  tolerated treatment well Patient left: in chair;with call bell/phone within reach Nurse Communication: Mobility status PT Visit Diagnosis: Difficulty in walking, not elsewhere classified (R26.2)     Time: 4401-02720821-0859 PT Time Calculation (min) (ACUTE ONLY): 38 min  Charges:  $Gait Training: 23-37 mins $Therapeutic Exercise: 8-22 mins                    G Codes:       Pg [phone]    Jahmal Dunavant 04/28/2017, 12:21 PM

## 2017-04-28 NOTE — Progress Notes (Signed)
Foley catheter removed at approximately 0630. Patient tolerated procedure

## 2017-04-28 NOTE — Progress Notes (Signed)
Physical Therapy Treatment Patient Details Name: Kaitlin Little MRN: [id number] DOB: September 19, 1946 Today's Date: 04/28/2017    History of Present Illness Pt s/p R THR and with hx of L THR ~4 yrs ago    PT Comments    Pt motivated and progressing steadily with mobility.   Follow Up Recommendations  Home health PT     Equipment Recommendations  Rolling walker with 5" wheels    Recommendations for Other Services OT consult     Precautions / Restrictions Precautions Precautions: Fall Restrictions Weight Bearing Restrictions: No Other Position/Activity Restrictions: WBAT    Mobility  Bed Mobility Overal bed mobility: Needs Assistance Bed Mobility: Supine to Sit     Supine to sit: Min assist     General bed mobility comments: OOB with RN and requests back to chair  Transfers Overall transfer level: Needs assistance Equipment used: Rolling walker (2 wheeled) Transfers: Sit to/from Stand Sit to Stand: Min guard         General transfer comment: cues for UE/LE placement  Ambulation/Gait Ambulation/Gait assistance: Min guard Ambulation Distance (Feet): 200 Feet Assistive device: Rolling walker (2 wheeled) Gait Pattern/deviations: Step-to pattern;Step-through pattern;Decreased step length - right;Decreased step length - left;Shuffle;Trunk flexed Gait velocity: decr Gait velocity interpretation: Below normal speed for age/gender General Gait Details: cues for posture, position from RW and initial sequence   Stairs            Wheelchair Mobility    Modified Rankin (Stroke Patients Only)       Balance Overall balance assessment: No apparent balance deficits (not formally assessed)                                          Cognition Arousal/Alertness: Awake/alert Behavior During Therapy: WFL for tasks assessed/performed Overall Cognitive Status: Within Functional Limits for tasks assessed                                         Exercises Total Joint Exercises Ankle Circles/Pumps: AROM;Both;15 reps;Supine Quad Sets: AROM;Both;10 reps;Supine Heel Slides: AAROM;Right;15 reps;Supine Hip ABduction/ADduction: AAROM;Right;10 reps;Supine    General Comments        Pertinent Vitals/Pain Pain Assessment: 0-10 Pain Score: 5  Pain Location: R hip Pain Descriptors / Indicators: Sore Pain Intervention(s): Limited activity within patient's tolerance;Monitored during session;Premedicated before session;Ice applied    Home Living                      Prior Function            PT Goals (current goals can now be found in the care plan section) Acute Rehab PT Goals Patient Stated Goal: Regain IND and walk without pain PT Goal Formulation: With patient Time For Goal Achievement: 05/01/17 Potential to Achieve Goals: Good Progress towards PT goals: Progressing toward goals    Frequency    7X/week      PT Plan Current plan remains appropriate    Co-evaluation              AM-PAC PT "6 Clicks" Daily Activity  Outcome Measure  Difficulty turning over in bed (including adjusting bedclothes, sheets and blankets)?: Total Difficulty moving from lying on back to sitting on the side of the bed? : Total Difficulty sitting down on  and standing up from a chair with arms (e.g., wheelchair, bedside commode, etc,.)?: Total Help needed moving to and from a bed to chair (including a wheelchair)?: A Little Help needed walking in hospital room?: A Little Help needed climbing 3-5 steps with a railing? : A Little 6 Click Score: 12    End of Session Equipment Utilized During Treatment: Gait belt Activity Tolerance: Patient tolerated treatment well Patient left: in chair;with call bell/phone within reach Nurse Communication: Mobility status PT Visit Diagnosis: Difficulty in walking, not elsewhere classified (R26.2)     Time: 0454-09811423-1451 PT Time Calculation (min) (ACUTE ONLY): 28 min  Charges:   $Gait Training: 8-22 mins $Therapeutic Exercise: 8-22 mins                    G Codes:       Pg [phone]    Prabhjot Piscitello 04/28/2017, 3:10 PM

## 2017-04-28 NOTE — Care Management Note (Signed)
Case Management Note  Patient Details  Name: Kaitlin Little MRN: [id number] Date of Birth: 05-Jun-1946  Subjective/Objective:      S/p R THR              Action/Plan: Discharge Planning: [facility]. [facility] RW and 3n1 bedside commode. Contacted [facility] for DME for home. Will deliver to room prior to dc. Husband at home to assist with care.    PCP Salli RealSUN, YUN MD  Expected Discharge Date:                  Expected Discharge Plan:  Home w Home Health Services  In-House Referral:  NA  Discharge planning Services  CM Consult  Post Acute Care Choice:  Durable Medical Equipment (Has rw) Choice offered to:  Patient  DME Arranged:  3-N-1, Walker rolling DME Agency:  [facility]  HH Arranged:  PT HH [facility]  Status of Service:  Completed, signed off  If discussed at MicrosoftLong Length of Stay Meetings, dates discussed:    Additional Comments:  Elliot CousinShavis, Milisa Kimbell Ellen, RN 04/28/2017, 10:55 AM

## 2017-04-28 NOTE — Progress Notes (Signed)
Patient ID: Kaitlin GriffinsSarah Little, female   DOB: [birth year], 71 y.o.   MRN: [id number] H/H low today, but vitals stable and not light-headed.  I spoke with her about staying until Monday and the possibility of a transfusion to treat acute blood loss anemia if it becomes symptomatic.

## 2017-04-28 NOTE — Progress Notes (Signed)
Subjective: Patient is stable.  Patient has walked in the halls.  Patient reports no dizziness when walking.   Objective: Vital signs in last 24 hours: Temp:  [97.6 F (36.4 C)-99 F (37.2 C)] 98.7 F (37.1 C) (07/28 0530) Pulse Rate:  [58-85] 80 (07/28 0530) Resp:  [15-18] 17 (07/28 0530) BP: (113-157)/(49-78) 113/53 (07/28 0530) SpO2:  [96 %-100 %] 99 % (07/28 0530)  Intake/Output from previous day: 07/27 0701 - 07/28 0700 In: 4473.6 [P.O.:1130; I.V.:3243.6; IV Piggyback:100] Out: 3555 [Urine:3155; Blood:400] Intake/Output this shift: Total I/O In: 240 [P.O.:240] Out: -   Exam:  Intact pulses distally Dorsiflexion/Plantar flexion intact  Labs:  Recent Labs  04/28/17 0511  HGB 7.5*    Recent Labs  04/28/17 0511  WBC 10.2  RBC 2.45*  HCT 22.1*  PLT 218    Recent Labs  04/28/17 0511  NA 136  K 4.5  CL 102  CO2 28  BUN 14  CREATININE 0.80  GLUCOSE 145*  CALCIUM 8.6*   No results for input(s): LABPT, INR in the last 72 hours.  Assessment/PlanPlan for mobilization today.  Possible discharge tomorrow.  Hemoglobin 7.5.  Patient is not currently symptomatic with that level.:    G Dorene GrebeScott Anastasya Jewell 04/28/2017, 9:24 AM

## 2017-04-28 NOTE — Discharge Instructions (Signed)

## 2017-04-28 NOTE — Evaluation (Signed)
Occupational Therapy Evaluation Patient Details Name: Ether GriffinsSarah Hancock MRN: [id number] DOB: Dec 04, 1945 Today's Date: 04/28/2017    History of Present Illness Pt s/p R THR and with hx of L THR ~4 yrs ago   Clinical Impression   Pt was admitted for the above sx. All education was completed. Pt had borrowed DME last sx and will need a 3:1 for home.  No further OT is needed at this time    Follow Up Recommendations  Supervision/Assistance - 24 hour    Equipment Recommendations  3 in 1 bedside commode    Recommendations for Other Services       Precautions / Restrictions Precautions Precautions: Fall Restrictions Weight Bearing Restrictions: No Other Position/Activity Restrictions: WBAT      Mobility Bed Mobility               General bed mobility comments: OOB  Transfers   Equipment used: Rolling walker (2 wheeled)   Sit to Stand: Min guard         General transfer comment: cues for UE/LE placement    Balance                                           ADL either performed or assessed with clinical judgement   ADL Overall ADL's : Needs assistance/impaired Eating/Feeding: Independent   Grooming: Supervision/safety;Standing   Upper Body Bathing: Set up;Sitting   Lower Body Bathing: Minimal assistance;Sit to/from stand   Upper Body Dressing : Set up;Sitting   Lower Body Dressing: Maximal assistance;Sit to/from stand   Toilet Transfer: Min guard;RW;BSC;Ambulation   Toileting- ArchitectClothing Manipulation and Hygiene: Min guard;Sit to/from stand         General ADL Comments: pt plans for husband to assist with adls.  She has a grab bar in the tub and plans to get a seat, on her own.  Reviewed tub sequence for stepping over     Vision         Perception     Praxis      Pertinent Vitals/Pain Pain Score: 2  Pain Location: R hip Pain Descriptors / Indicators: Sore Pain Intervention(s): Limited activity within patient's  tolerance;Monitored during session;Premedicated before session;Repositioned;Ice applied     Hand Dominance     Extremity/Trunk Assessment Upper Extremity Assessment Upper Extremity Assessment: Overall WFL for tasks assessed           Communication     Cognition Arousal/Alertness: Awake/alert Behavior During Therapy: WFL for tasks assessed/performed Overall Cognitive Status: Within Functional Limits for tasks assessed                                     General Comments       Exercises     Shoulder Instructions      Home Living Family/patient expects to be discharged to:: Private residence Living Arrangements: Spouse/significant other                 Bathroom Shower/Tub: Chief Strategy OfficerTub/shower unit   Bathroom Toilet: Standard         Additional Comments: BOUGHT AN ETS BUT WOULD RATHER HAVE 3:1      Prior Functioning/Environment Level of Independence: Independent;Independent with assistive device(s)                 OT Problem List:  OT Treatment/Interventions:      OT Goals(Current goals can be found in the care plan section) Acute Rehab OT Goals Patient Stated Goal: Regain IND and walk without pain OT Goal Formulation: All assessment and education complete, DC therapy  OT Frequency:     Barriers to D/C:            Co-evaluation              AM-PAC PT "6 Clicks" Daily Activity     Outcome Measure Help from another person eating meals?: None Help from another person taking care of personal grooming?: A Little Help from another person toileting, which includes using toliet, bedpan, or urinal?: A Little Help from another person bathing (including washing, rinsing, drying)?: A Little Help from another person to put on and taking off regular upper body clothing?: A Little Help from another person to put on and taking off regular lower body clothing?: A Lot 6 Click Score: 18   End of Session    Activity Tolerance: Patient  tolerated treatment well Patient left: in chair;with call bell/phone within reach  OT Visit Diagnosis: Pain Pain - Right/Left: Right Pain - part of body: Hip                Time: 1610-96040917-0940 OT Time Calculation (min): 23 min Charges:  OT Evaluation $OT Eval Low Complexity: 1 Procedure G-Codes:     BracevilleMaryellen Evelyn Moch, OTR/L [phone] 04/28/2017  Kamri Gotsch 04/28/2017, 10:43 AM

## 2017-04-29 LAB — CBC
HCT: 21.4 % — ABNORMAL LOW (ref 36.0–46.0)
HEMOGLOBIN: 7.1 g/dL — AB (ref 12.0–15.0)
MCH: 30.5 pg (ref 26.0–34.0)
MCHC: 33.2 g/dL (ref 30.0–36.0)
MCV: 91.8 fL (ref 78.0–100.0)
PLATELETS: 202 10*3/uL (ref 150–400)
RBC: 2.33 MIL/uL — AB (ref 3.87–5.11)
RDW: 15.1 % (ref 11.5–15.5)
WBC: 9.3 10*3/uL (ref 4.0–10.5)

## 2017-04-29 NOTE — Progress Notes (Signed)
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 Plans to DC tomorrow.   Follow Up Recommendations  Home health PT     Equipment Recommendations  Rolling walker with 5" wheels    Recommendations for Other Services       Precautions / Restrictions Precautions Precautions: Fall    Mobility  Bed Mobility   Bed Mobility: Supine to Sit     Supine to sit: Min assist     General bed mobility comments: assist with right leg  Transfers Overall transfer level: Needs assistance Equipment used: Rolling walker (2 wheeled) Transfers: Sit to/from Stand Sit to Stand: Min guard         General transfer comment: cues for UE/LE placement  Ambulation/Gait Ambulation/Gait assistance: Min guard Ambulation Distance (Feet): 200 Feet Assistive device: Rolling walker (2 wheeled) Gait Pattern/deviations: Step-to pattern;Step-through pattern Gait velocity: decr   General Gait Details: cues for posture, position from RW and initial sequence   Stairs            Wheelchair Mobility    Modified Rankin (Stroke Patients Only)       Balance                                            Cognition Arousal/Alertness: Awake/alert                                            Exercises Total Joint Exercises Ankle Circles/Pumps: AROM;Both;15 reps;Supine Quad Sets: AROM;Both;10 reps;Supine Short Arc Quad: AROM;Right;10 reps;Supine Heel Slides: AAROM;Right;10 reps;Supine Hip ABduction/ADduction: AAROM;Right;10 reps;Supine    General Comments        Pertinent Vitals/Pain Pain Score: 5  Pain Location: R hip Pain Descriptors / Indicators: Sore Pain Intervention(s): Premedicated before session;Repositioned;Ice applied    Home Living                       Prior Function            PT Goals (current goals can now be found in the care plan section) Progress towards PT goals: Progressing toward goals    Frequency    7X/week      PT Plan Current plan remains appropriate    Co-evaluation              AM-PAC PT "6 Clicks" Daily Activity  Outcome Measure  Difficulty turning over in bed (including adjusting bedclothes, sheets and blankets)?: Total Difficulty moving from lying on back to sitting on the side of the bed? : Total Difficulty sitting down on and standing up from a chair with arms (e.g., wheelchair, bedside commode, etc,.)?: Total Help needed moving to and from a bed to chair (including a wheelchair)?: A Little Help needed walking in hospital room?: A Little Help needed climbing 3-5 steps with a railing? : A Lot 6 Click Score: 11    End of Session   Activity Tolerance: Patient tolerated treatment well Patient left: in chair;with call bell/phone within reach Nurse Communication: Mobility status PT Visit Diagnosis:  Difficulty in walking, not elsewhere classified (R26.2);Pain Pain - Right/Left: Right Pain - part of body: Hip     Time: 8119-14781122-1145 PT Time Calculation (min) (ACUTE ONLY): 23 min  Charges:  $Gait Training: 8-22 mins $Therapeutic Exercise: 8-22 mins                    G CodesBlanchard Kelch:       Makaria Poarch PT [phone]    Rada HayHill, Mickie Badders Elizabeth 04/29/2017, 2:23 PM

## 2017-04-29 NOTE — Progress Notes (Signed)
Subjective: Patient stable.  Hip doing well.  No dizziness when walking.   Objective: Vital signs in last 24 hours: Temp:  [98.7 F (37.1 C)-99.4 F (37.4 C)] 99 F (37.2 C) (07/29 0631) Pulse Rate:  [90-95] 92 (07/29 0631) Resp:  [15-16] 15 (07/29 0631) BP: (119-137)/(52-64) 127/61 (07/29 0631) SpO2:  [92 %-93 %] 92 % (07/29 0631)  Intake/Output from previous day: 07/28 0701 - 07/29 0700 In: 720 [P.O.:720] Out: 1300 [Urine:1300] Intake/Output this shift: Total I/O In: 120 [P.O.:120] Out: -   Exam:  Dorsiflexion/Plantar flexion intact  Labs:  Recent Labs  04/28/17 0511 04/29/17 0519  HGB 7.5* 7.1*    Recent Labs  04/28/17 0511 04/29/17 0519  WBC 10.2 9.3  RBC 2.45* 2.33*  HCT 22.1* 21.4*  PLT 218 202    Recent Labs  04/28/17 0511  NA 136  K 4.5  CL 102  CO2 28  BUN 14  CREATININE 0.80  GLUCOSE 145*  CALCIUM 8.6*   No results for input(s): LABPT, INR in the last 72 hours.  Assessment/Plan: Impression is patient's doing well following hip replacement.  Hemoglobin is trending downward to 7.1 today.  We will check it one more time tomorrow and then anticipate discharge tomorrow.   G Scott Maizey Menendez 04/29/2017, 8:28 AM

## 2017-04-30 ENCOUNTER — Telehealth (INDEPENDENT_AMBULATORY_CARE_PROVIDER_SITE_OTHER): Payer: Self-pay

## 2017-04-30 ENCOUNTER — Inpatient Hospital Stay (INDEPENDENT_AMBULATORY_CARE_PROVIDER_SITE_OTHER): Payer: Medicare Other | Admitting: Orthopaedic Surgery

## 2017-04-30 ENCOUNTER — Telehealth (INDEPENDENT_AMBULATORY_CARE_PROVIDER_SITE_OTHER): Payer: Self-pay | Admitting: Orthopaedic Surgery

## 2017-04-30 LAB — CBC
HCT: 20.6 % — ABNORMAL LOW (ref 36.0–46.0)
HEMOGLOBIN: 6.8 g/dL — AB (ref 12.0–15.0)
MCH: 30.5 pg (ref 26.0–34.0)
MCHC: 33 g/dL (ref 30.0–36.0)
MCV: 92.4 fL (ref 78.0–100.0)
PLATELETS: 209 10*3/uL (ref 150–400)
RBC: 2.23 MIL/uL — AB (ref 3.87–5.11)
RDW: 15.1 % (ref 11.5–15.5)
WBC: 7.5 10*3/uL (ref 4.0–10.5)

## 2017-04-30 LAB — PREPARE RBC (CROSSMATCH)

## 2017-04-30 MED ORDER — SODIUM CHLORIDE 0.9 % IV SOLN
Freq: Once | INTRAVENOUS | Status: AC
  Administered 2017-04-30: 09:00:00 via INTRAVENOUS

## 2017-04-30 MED ORDER — SODIUM CHLORIDE 0.9 % IV SOLN: Freq: Once | INTRAVENOUS | Status: AC

## 2017-04-30 MED ORDER — ASPIRIN 81 MG PO CHEW: 81.0000 mg | CHEWABLE_TABLET | Freq: Two times a day (BID) | ORAL | 0 refills | Status: DC

## 2017-04-30 MED ORDER — METHOCARBAMOL 500 MG PO TABS: 500.0000 mg | ORAL_TABLET | Freq: Four times a day (QID) | ORAL | 0 refills | Status: DC | PRN

## 2017-04-30 MED ORDER — OXYCODONE-ACETAMINOPHEN 10-325 MG PO TABS: 1.0000 | ORAL_TABLET | Freq: Four times a day (QID) | ORAL | 0 refills | Status: AC | PRN

## 2017-04-30 NOTE — Progress Notes (Signed)
Patient ID: Kaitlin GriffinsSarah Merica, female   DOB: [birth year], 71 y.o.   MRN: [id number] Doing well overall.  Right hip stable and vitals stable.  However, Hgb trending down.  I spoke with the patient about this and she agrees to a transfusion of one unit of blood.  We can still discahrge her to home this afternoon after she gets the blood and she aggress with this as well.

## 2017-04-30 NOTE — Telephone Encounter (Signed)
She is on my schedule today and I'm not 100% sure why, because she was just discharged today I believe. Can you reschedule her next Thursday with Bronson CurbGil please  Thank you so much

## 2017-04-30 NOTE — Progress Notes (Signed)
CRITICAL VALUE ALERT  Critical Value: Hgb 6.8  Date & Time Notied:  04/30/17 @0546   Provider Notified: Dr August Saucerean  Orders Received/Actions taken: Transfuse 1 unit of PRBC

## 2017-04-30 NOTE — Discharge Summary (Signed)
Patient ID: Kaitlin Little MRN: [id number] DOB/AGE: [birth year] 70 y.o.  Admit date: 04/27/2017 Discharge date: 04/30/2017  Admission Diagnoses:  Principal Problem:   Unilateral primary osteoarthritis, right hip Active Problems:   Status post total replacement of right hip   Discharge Diagnoses:  Same  Past Medical History:  Diagnosis Date  . Anemia    bleeding ulcers no blood transfusions  . Arthritis   . Depression   . GERD (gastroesophageal reflux disease)   . Headache    sinus headaches  . History of hiatal hernia   . Hypertension   . Hyperthyroidism    benign nodules in neck no meds    Surgeries: Procedure(s): RIGHT TOTAL HIP ARTHROPLASTY ANTERIOR APPROACH on 04/27/2017   Consultants:   Discharged Condition: Improved  Hospital Course: Kaitlin Little is an 71 y.o. female who was admitted 04/27/2017 for operative treatment ofUnilateral primary osteoarthritis, right hip. Patient has severe unremitting pain that affects sleep, daily activities, and work/hobbies. After pre-op clearance the patient was taken to the operating room on 04/27/2017 and underwent  Procedure(s): RIGHT TOTAL HIP ARTHROPLASTY ANTERIOR APPROACH.    Patient was given perioperative antibiotics: Anti-infectives    Start     Dose/Rate Route Frequency Ordered Stop   04/27/17 1400  clindamycin (CLEOCIN) IVPB 600 mg     600 mg 100 mL/hr over 30 Minutes Intravenous Every 6 hours 04/27/17 1006 04/27/17 2101   04/27/17 0534  clindamycin (CLEOCIN) IVPB 900 mg     900 mg 100 mL/hr over 30 Minutes Intravenous On call to O.R. 04/27/17 0534 04/27/17 0727       Patient was given sequential compression devices, early ambulation, and chemoprophylaxis to prevent DVT.  Patient benefited maximally from hospital stay and there were no complications.  She did require a transfusion of one unit of blood prior to discharge due to acute blood loss anemia.  Recent vital signs: Patient Vitals for the past 24 hrs:  BP Temp Temp  src Pulse Resp SpO2  04/30/17 0520 (!) 121/96 98.9 F (37.2 C) Oral 91 17 94 %  04/29/17 2108 (!) 158/49 99.2 F (37.3 C) Oral 98 16 97 %  04/29/17 1320 112/60 98.7 F (37.1 C) Oral 90 15 97 %     Recent laboratory studies:  Recent Labs  04/28/17 0511 04/29/17 0519 04/30/17 0504  WBC 10.2 9.3 7.5  HGB 7.5* 7.1* 6.8*  HCT 22.1* 21.4* 20.6*  PLT 218 202 209  NA 136  --   --   K 4.5  --   --   CL 102  --   --   CO2 28  --   --   BUN 14  --   --   CREATININE 0.80  --   --   GLUCOSE 145*  --   --   CALCIUM 8.6*  --   --      Discharge Medications:   Allergies as of 04/30/2017      Reactions   Adhesive [tape] Other (See Comments)   Skin tears/redness. Cloth/paper tape tolerates   Ampicillin Hives, Diarrhea   TOLERATES ORALLY NOT INTRAVENOUS  Has patient had a PCN reaction causing immediate rash, facial/tongue/throat swelling, SOB or lightheadedness with hypotension:No Has patient had a PCN reaction causing severe rash involving mucus membranes or skin necrosis:Unknown Has patient had a PCN reaction that required hospitalization:Inpatient when reaction occurred. Has patient had a PCN reaction occurring within the last 10 years: No If all of the above answers are "NO", then  may proceed with Cephalosporin   Latex Other (See Comments)   Skin redness/peels skin      Medication List    TAKE these medications   amLODipine 5 MG tablet Commonly known as:  NORVASC Take 5 mg by mouth daily.   aspirin 81 MG chewable tablet Chew 1 tablet (81 mg total) by mouth 2 (two) times daily.   Biotin 10 MG Caps Take 10 mg by mouth 2 (two) times a week.   Coenzyme Q10 200 MG capsule Take 200 mg by mouth daily.   COOL N HEAT/BACK 5 % Ptch Generic drug:  Menthol Place 1 patch onto the skin daily as needed (for pain.).   DULoxetine 60 MG capsule Commonly known as:  CYMBALTA Take 60 mg by mouth daily.   esomeprazole 40 MG capsule Commonly known as:  NEXIUM Take 40 mg by mouth  daily before breakfast.   methocarbamol 500 MG tablet Commonly known as:  ROBAXIN Take 1 tablet (500 mg total) by mouth every 6 (six) hours as needed for muscle spasms.   MOVANTIK 25 MG Tabs tablet Generic drug:  naloxegol oxalate Take 25 mg by mouth daily.   multivitamin with minerals Tabs tablet Take 1 tablet by mouth daily.   oxyCODONE-acetaminophen 10-325 MG tablet Commonly known as:  PERCOCET Take 1 tablet by mouth every 6 (six) hours as needed for pain. What changed:  when to take this  reasons to take this   simvastatin 10 MG tablet Commonly known as:  ZOCOR Take 10 mg by mouth every evening.   Vitamin D 2000 units Caps Take 2,000 Units by mouth daily.   zolpidem 10 MG tablet Commonly known as:  AMBIEN Take 10 mg by mouth at bedtime as needed. For sleep.            Durable Medical Equipment        Start     Ordered   04/28/17 1025  For home use only DME 3 n 1  Once     04/28/17 1025   04/27/17 1007  DME Walker rolling  Once    Question:  Patient needs a walker to treat with the following condition  Answer:  Status post total replacement of right hip   04/27/17 1006   04/27/17 1007  DME 3 n 1  Once     04/27/17 1006      Diagnostic Studies: Dg Pelvis Portable  Result Date: 04/27/2017 CLINICAL DATA:  Right hip replacement EXAM: PORTABLE PELVIS 1-2 VIEWS COMPARISON:  01/08/2017 FINDINGS: Changes of new right hip replacement. Overlying soft tissue gas noted. No hardware complicating feature. Normal AP alignment. Remote changes of left hip replacement. IMPRESSION: New right hip replacement.  No visible complicating feature. Electronically Signed   By: Charlett NoseKevin  Dover M.D.   On: 04/27/2017 09:29   Dg C-arm 1-60 Min-no Report  Result Date: 04/27/2017 Fluoroscopy was utilized by the requesting physician.  No radiographic interpretation.   Dg Hip Operative Unilat With Pelvis Right  Result Date: 04/27/2017 CLINICAL DATA:  Right hip replacement . EXAM:  OPERATIVE RIGHT HIP (WITH PELVIS IF PERFORMED) 2 VIEWS TECHNIQUE: Fluoroscopic spot image(s) were submitted for interpretation post-operatively. COMPARISON:  01/08/2017 . FINDINGS: Total right hip replacement with anatomic alignment. Hardware intact . No acute bony abnormality. IMPRESSION: Total right hip replacement.  Anatomic alignment. Electronically Signed   By: Maisie Fushomas  Register   On: 04/27/2017 08:43    Disposition: 01-Home or Self Care  Discharge Instructions    Discharge patient  Complete by:  As directed    Discharge disposition:  01-Home or Self Care   Discharge patient date:  04/30/2017      Follow-up Information    [facility] Follow up.   Specialty:  Home Health Services Why:  Home Health Physical Therapy Contact information: [address] [place] [place] [address] [phone]        Kathryne HitchBlackman, Maraki Macquarrie Y, MD Follow up in 2 week(s).   Specialty:  Orthopedic Surgery Contact information: [address] [place] [address] [phone]            Signed: Kathryne HitchChristopher Y Laithan Conchas 04/30/2017, 7:05 AM

## 2017-04-30 NOTE — Telephone Encounter (Signed)
See below

## 2017-04-30 NOTE — Telephone Encounter (Signed)
Patient called advised her pain management doctor is prescribing the Oxycodone for her. The insurance company will not pay for the same Rx.  Patient said she can be called back tomorrow. It doesn't have to be today. The number to contact patient is [phone]

## 2017-04-30 NOTE — Progress Notes (Signed)
Physical Therapy Treatment Patient Details Name: Kaitlin GriffinsSarah Little MRN: [id number] DOB: Apr 10, 1946 Today's Date: 04/30/2017    History of Present Illness Pt s/p R THR and with hx of L THR ~4 yrs ago    PT Comments    Pt making excellent progress, feels ready to D/c home  Follow Up Recommendations  Home health PT     Equipment Recommendations  Rolling walker with 5" wheels    Recommendations for Other Services       Precautions / Restrictions Precautions Precautions: Fall Restrictions Weight Bearing Restrictions: No Other Position/Activity Restrictions: WBAT    Mobility  Bed Mobility Overal bed mobility: Needs Assistance Bed Mobility: Supine to Sit     Supine to sit: Supervision;Min guard     General bed mobility comments: initial light assist with right leg  Transfers Overall transfer level: Needs assistance Equipment used: Rolling walker (2 wheeled) Transfers: Sit to/from Stand Sit to Stand: Supervision         General transfer comment: cues for UE/LE placement  Ambulation/Gait Ambulation/Gait assistance: Supervision;Min guard Ambulation Distance (Feet): 150 Feet Assistive device: Rolling walker (2 wheeled) Gait Pattern/deviations: Step-to pattern;Step-through pattern Gait velocity: decr   General Gait Details: cues for posture, position from RW and initial sequence   Stairs            Wheelchair Mobility    Modified Rankin (Stroke Patients Only)       Balance                                            Cognition Arousal/Alertness: Awake/alert Behavior During Therapy: WFL for tasks assessed/performed Overall Cognitive Status: Within Functional Limits for tasks assessed                                        Exercises Total Joint Exercises Ankle Circles/Pumps: AROM;Both;15 reps;Supine Quad Sets: AROM;Both;10 reps;Supine Heel Slides: AAROM;Right;10 reps;Supine Hip ABduction/ADduction: AAROM;Right;10  reps;Supine    General Comments        Pertinent Vitals/Pain Pain Assessment: 0-10 Pain Score: 5  Pain Location: R hip Pain Descriptors / Indicators: Sore Pain Intervention(s): Limited activity within patient's tolerance;Monitored during session;Patient requesting pain meds-RN notified    Home Living                      Prior Function            PT Goals (current goals can now be found in the care plan section) Acute Rehab PT Goals Patient Stated Goal: Regain IND and walk without pain PT Goal Formulation: With patient Time For Goal Achievement: 05/01/17 Potential to Achieve Goals: Good Progress towards PT goals: Progressing toward goals    Frequency    7X/week      PT Plan Current plan remains appropriate    Co-evaluation              AM-PAC PT "6 Clicks" Daily Activity  Outcome Measure  Difficulty turning over in bed (including adjusting bedclothes, sheets and blankets)?: A Little Difficulty moving from lying on back to sitting on the side of the bed? : Total Difficulty sitting down on and standing up from a chair with arms (e.g., wheelchair, bedside commode, etc,.)?: Total Help needed moving to and from a bed to  chair (including a wheelchair)?: A Little Help needed walking in hospital room?: A Little Help needed climbing 3-5 steps with a railing? : A Little 6 Click Score: 14    End of Session Equipment Utilized During Treatment: Gait belt Activity Tolerance: Patient tolerated treatment well Patient left: in chair;with call bell/phone within reach   PT Visit Diagnosis: Difficulty in walking, not elsewhere classified (R26.2);Pain Pain - Right/Left: Right Pain - part of body: Hip     Time: [id number] PT Time Calculation (min) (ACUTE ONLY): 16 min  Charges:  $Therapeutic Exercise: 8-22 mins                    G CodesDrucilla Chalet:       [name], PT Pager: [phone] 04/30/2017    Drucilla ChaletWILLIAMS,[name] 04/30/2017, 1:00 PM

## 2017-04-30 NOTE — Progress Notes (Signed)
Pt was discharged home today. Instructions were reviewed with patient, prescriptions were given  and questions were answered. Pt was taken to main entrance via wheelchair by NT.  

## 2017-04-30 NOTE — Telephone Encounter (Signed)
Patient scheduled 05/10/17 at 10:00am with Kaitlin Little

## 2017-05-01 LAB — BPAM RBC
Blood Product Expiration Date: 201808102359
ISSUE DATE / TIME: 201807300848
Unit Type and Rh: 6200

## 2017-05-01 LAB — TYPE AND SCREEN
ABO/RH(D): A POS
Antibody Screen: NEGATIVE
UNIT DIVISION: 0

## 2017-05-03 ENCOUNTER — Telehealth (INDEPENDENT_AMBULATORY_CARE_PROVIDER_SITE_OTHER): Payer: Self-pay | Admitting: Orthopaedic Surgery

## 2017-05-03 MED ORDER — HYDROMORPHONE HCL 2 MG PO TABS: 2.0000 mg | ORAL_TABLET | ORAL | 0 refills | Status: DC | PRN

## 2017-05-03 NOTE — Telephone Encounter (Signed)
Patient aware Rx ready at front desk  

## 2017-05-03 NOTE — Telephone Encounter (Signed)
Patient called advised her insurance will not cover the Rx (Oxycodone)  Because it's the same as the Rx her pain management company prescribed for her. The number to contact patient is [phone]

## 2017-05-03 NOTE — Telephone Encounter (Signed)
States she needs something different for pain, her insurance won't cover oxycodone you gave her because her pain mgmt gives her that. She is running out of that Rx cause she's taking it every 4 hours like you told her too, so pharmacy won't refill yet She said easier to give her something different

## 2017-05-03 NOTE — Telephone Encounter (Signed)
She can come pick up a prescription for Dilaudid.

## 2017-05-10 ENCOUNTER — Inpatient Hospital Stay (INDEPENDENT_AMBULATORY_CARE_PROVIDER_SITE_OTHER): Payer: Medicare Other | Admitting: Physician Assistant

## 2017-05-14 ENCOUNTER — Inpatient Hospital Stay (INDEPENDENT_AMBULATORY_CARE_PROVIDER_SITE_OTHER): Payer: Medicare Other | Admitting: Physician Assistant

## 2017-05-14 ENCOUNTER — Ambulatory Visit (INDEPENDENT_AMBULATORY_CARE_PROVIDER_SITE_OTHER): Payer: Medicare Other | Admitting: Orthopaedic Surgery

## 2017-05-14 DIAGNOSIS — Z96641 Presence of right artificial hip joint: Secondary | ICD-10-CM

## 2017-05-14 NOTE — Progress Notes (Signed)
The patient is 2 weeks status post a right total hip arthroplasty through direct anterior approach. She says she is doing well.  On examination her incision looks great. I placed knee Steri-Strips. She is having some irritation from the adhesive from the dressing itself. There is a moderate seroma and I did drain 90 mL of fluid off of her hip. There is no evidence of infection. Her leg lengths are equal. She is ambulating with a cane.  At this point we'll see her back in a month to see how she is doing overall. She'll stop her twice daily aspirin. All questions were encouraged and answered. She's been already released from home therapy. She'll still use the cane until she is comfortable going without it. Obviously if she has recurrence in her seroma we can always see her back in early to drain.

## 2017-05-21 ENCOUNTER — Inpatient Hospital Stay (INDEPENDENT_AMBULATORY_CARE_PROVIDER_SITE_OTHER): Payer: Medicare Other | Admitting: Physician Assistant

## 2017-06-11 ENCOUNTER — Ambulatory Visit (INDEPENDENT_AMBULATORY_CARE_PROVIDER_SITE_OTHER): Payer: Medicare Other | Admitting: Orthopaedic Surgery

## 2017-06-11 DIAGNOSIS — Z96641 Presence of right artificial hip joint: Secondary | ICD-10-CM

## 2017-06-11 NOTE — Progress Notes (Signed)
The patient is now 6 weeks status post a right total hip arthroplasty through direct injury approach. She uses a cane to walk with but she said she's not using it at home and only when she is in unfamiliar environments. She is doing well overall. She is still on oxycodone for her pain management.  On examination her right hip was fluidly. She cross her legs easily. Her leg lengths are equal. There is no significant seroma at all. Incisions well-healed.  At this point she'll continue increase her activities as comfort allows. She'll get rid of her cane when she feels comfortable as well. I'll see her back in 6 months with just a low AP pelvis.

## 2017-06-12 ENCOUNTER — Emergency Department (HOSPITAL_COMMUNITY): Payer: Medicare Other

## 2017-06-12 ENCOUNTER — Emergency Department (HOSPITAL_COMMUNITY)
Admission: EM | Admit: 2017-06-12 | Discharge: 2017-06-12 | Disposition: A | Discharge status: Home / Self Care | Payer: Medicare Other | Attending: Emergency Medicine | Admitting: Emergency Medicine

## 2017-06-12 ENCOUNTER — Encounter (HOSPITAL_COMMUNITY): Payer: Self-pay | Admitting: Emergency Medicine

## 2017-06-12 DIAGNOSIS — Y929 Unspecified place or not applicable: Secondary | ICD-10-CM | POA: Insufficient documentation

## 2017-06-12 DIAGNOSIS — Y939 Activity, unspecified: Secondary | ICD-10-CM | POA: Diagnosis not present

## 2017-06-12 DIAGNOSIS — Z87891 Personal history of nicotine dependence: Secondary | ICD-10-CM | POA: Diagnosis not present

## 2017-06-12 DIAGNOSIS — Y999 Unspecified external cause status: Secondary | ICD-10-CM | POA: Insufficient documentation

## 2017-06-12 DIAGNOSIS — Z79899 Other long term (current) drug therapy: Secondary | ICD-10-CM | POA: Insufficient documentation

## 2017-06-12 DIAGNOSIS — Z91040 Latex allergy status: Secondary | ICD-10-CM | POA: Diagnosis not present

## 2017-06-12 DIAGNOSIS — W010XXA Fall on same level from slipping, tripping and stumbling without subsequent striking against object, initial encounter: Secondary | ICD-10-CM

## 2017-06-12 DIAGNOSIS — S161XXA Strain of muscle, fascia and tendon at neck level, initial encounter: Secondary | ICD-10-CM | POA: Insufficient documentation

## 2017-06-12 DIAGNOSIS — S0181XA Laceration without foreign body of other part of head, initial encounter: Secondary | ICD-10-CM | POA: Insufficient documentation

## 2017-06-12 DIAGNOSIS — I10 Essential (primary) hypertension: Secondary | ICD-10-CM | POA: Diagnosis not present

## 2017-06-12 DIAGNOSIS — Z96643 Presence of artificial hip joint, bilateral: Secondary | ICD-10-CM | POA: Insufficient documentation

## 2017-06-12 DIAGNOSIS — Z23 Encounter for immunization: Secondary | ICD-10-CM | POA: Diagnosis not present

## 2017-06-12 DIAGNOSIS — W0110XA Fall on same level from slipping, tripping and stumbling with subsequent striking against unspecified object, initial encounter: Secondary | ICD-10-CM | POA: Insufficient documentation

## 2017-06-12 DIAGNOSIS — Z7982 Long term (current) use of aspirin: Secondary | ICD-10-CM | POA: Insufficient documentation

## 2017-06-12 DIAGNOSIS — S60222A Contusion of left hand, initial encounter: Secondary | ICD-10-CM | POA: Diagnosis not present

## 2017-06-12 DIAGNOSIS — S098XXA Other specified injuries of head, initial encounter: Secondary | ICD-10-CM | POA: Diagnosis present

## 2017-06-12 MED ORDER — LIDOCAINE-EPINEPHRINE-TETRACAINE (LET) SOLUTION
3.0000 mL | Freq: Once | NASAL | Status: AC
  Administered 2017-06-12: 3 mL via TOPICAL
  Filled 2017-06-12: qty 3

## 2017-06-12 MED ORDER — LIDOCAINE-EPINEPHRINE-TETRACAINE (LET) SOLUTION: 3.0000 mL | Freq: Once | NASAL | Status: DC

## 2017-06-12 MED ORDER — ACETAMINOPHEN 500 MG PO TABS
1000.0000 mg | ORAL_TABLET | Freq: Once | ORAL | Status: AC
  Administered 2017-06-12: 1000 mg via ORAL
  Filled 2017-06-12: qty 2

## 2017-06-12 MED ORDER — LIDOCAINE-EPINEPHRINE 2 %-1:200000 IJ SOLN
20.0000 mL | Freq: Once | INTRAMUSCULAR | Status: AC
Start: 2017-06-12 — End: 2017-06-12
  Administered 2017-06-12: 20 mL

## 2017-06-12 MED ORDER — TETANUS-DIPHTH-ACELL PERTUSSIS 5-2.5-18.5 LF-MCG/0.5 IM SUSP
0.5000 mL | Freq: Once | INTRAMUSCULAR | Status: AC
  Administered 2017-06-12: 0.5 mL via INTRAMUSCULAR
  Filled 2017-06-12: qty 0.5

## 2017-06-12 MED ORDER — LIDOCAINE-EPINEPHRINE (PF) 2 %-1:200000 IJ SOLN
20.0000 mL | Freq: Once | INTRAMUSCULAR | Status: DC
  Filled 2017-06-12: qty 20

## 2017-06-12 MED ORDER — BACITRACIN ZINC 500 UNIT/GM EX OINT
TOPICAL_OINTMENT | Freq: Two times a day (BID) | CUTANEOUS | Status: DC
  Administered 2017-06-12: 1 via TOPICAL
  Filled 2017-06-12: qty 0.9

## 2017-06-12 NOTE — ED Provider Notes (Signed)
[facility]-EMERGENCY DEPT Provider Note   CSN: [id number] Arrival date & time: 06/12/17  4098     History   Chief Complaint Chief Complaint  Patient presents with  . Fall  . Facial Laceration  . Neck Pain    HPI Kaitlin Little is a 71 y.o. female.  Patient c/o trip and fall over her cat this AM, fell forward, hit head, brief loc, and also c/o headache post hitting head as well as neck pain. Pain is dull, moderate, constant, non radiating. Also contusion to left hand. Denies faintness or dizziness prior to fall. No nausea or vomiting. Lac to face x 2.    The history is provided by the patient.  Fall  Pertinent negatives include no chest pain, no abdominal pain and no shortness of breath.  Neck Pain   Pertinent negatives include no chest pain, no numbness and no weakness.    Past Medical History:  Diagnosis Date  . Anemia    bleeding ulcers no blood transfusions  . Arthritis   . Depression   . GERD (gastroesophageal reflux disease)   . Headache    sinus headaches  . History of hiatal hernia   . Hypertension   . Hyperthyroidism    benign nodules in neck no meds    Patient Active Problem List   Diagnosis Date Noted  . Status post total replacement of right hip 04/27/2017  . Pain in right hip 01/08/2017  . Unilateral primary osteoarthritis, right hip 01/08/2017    Past Surgical History:  Procedure Laterality Date  . ABDOMINAL HYSTERECTOMY     partial  . BACK SURGERY     lower  . BREAST SURGERY     lumpectomy bil breast  . FOOT SURGERY     bil bunions and straightening great toe  . JOINT REPLACEMENT     Left hip right hip 04/27/17 Dr. Salena Saner blackman  . knee arthroscopy     left  . TOTAL HIP ARTHROPLASTY Right 04/27/2017   Procedure: RIGHT TOTAL HIP ARTHROPLASTY ANTERIOR APPROACH;  Surgeon: Kathryne Hitch, MD;  Location: [facility];  Service: Orthopedics;  Laterality: Right;    OB History    No data available       Home Medications    Prior to Admission  medications   Medication Sig Start Date End Date Taking? Authorizing Provider  amLODipine (NORVASC) 5 MG tablet Take 5 mg by mouth daily. 11/05/15   [provider]  aspirin 81 MG chewable tablet Chew 1 tablet (81 mg total) by mouth 2 (two) times daily. 04/30/17   Kathryne Hitch, MD  Biotin 10 MG CAPS Take 10 mg by mouth 2 (two) times a week.     [provider]  Cholecalciferol (VITAMIN D) 2000 units CAPS Take 2,000 Units by mouth daily.    [provider]  Coenzyme Q10 200 MG capsule Take 200 mg by mouth daily.    [provider]  DULoxetine (CYMBALTA) 60 MG capsule Take 60 mg by mouth daily. 01/05/16   [provider]  esomeprazole (NEXIUM) 40 MG capsule Take 40 mg by mouth daily before breakfast. 02/10/17   [provider]  HYDROmorphone (DILAUDID) 2 MG tablet Take 1 tablet (2 mg total) by mouth every 4 (four) hours as needed for severe pain. Patient not taking: Reported on 06/11/2017 05/03/17   Kathryne Hitch, MD  Menthol (COOL N HEAT/BACK) 5 % [facility] 1 patch onto the skin daily as needed (for pain.).  [provider]  methocarbamol (ROBAXIN) 500 MG tablet Take 1 tablet (500 mg total) by mouth every 6 (six) hours as needed for muscle spasms. 04/30/17   Kathryne HitchBlackman, Christopher Y, MD  MOVANTIK 25 MG TABS tablet Take 25 mg by mouth daily. 01/17/16   [provider]  Multiple Vitamin (MULTIVITAMIN WITH MINERALS) TABS tablet Take 1 tablet by mouth daily.    [provider]  oxyCODONE-acetaminophen (PERCOCET) 10-325 MG tablet Take 1 tablet by mouth every 6 (six) hours as needed for pain. 04/30/17   Kathryne HitchBlackman, Christopher Y, MD  simvastatin (ZOCOR) 10 MG tablet Take 10 mg by mouth every evening.  12/11/15   [provider]  zolpidem (AMBIEN) 10 MG tablet Take 10 mg by mouth at bedtime as needed. For sleep. 03/14/17   [provider]    Family History No family history on file.  Social  History Social History  Substance Use Topics  . Smoking status: Former Smoker    Packs/day: 0.50    Types: Cigarettes  . Smokeless tobacco: Never Used     Comment: 40 years  . Alcohol use 0.6 oz/week    1 Glasses of wine per week     Comment: 4x /month     Allergies   Adhesive [tape]; Ampicillin; and Latex   Review of Systems Review of Systems  Constitutional: Negative for fever.  HENT: Negative for nosebleeds.   Eyes: Negative for redness.  Respiratory: Negative for shortness of breath.   Cardiovascular: Negative for chest pain.  Gastrointestinal: Negative for abdominal pain.  Genitourinary: Negative for flank pain.  Musculoskeletal: Positive for neck pain. Negative for back pain.  Skin: Negative for rash.  Neurological: Negative for weakness and numbness.  Hematological: Does not bruise/bleed easily.  Psychiatric/Behavioral: Negative for confusion.     Physical Exam Updated Vital Signs BP (!) 155/82 (BP Location: Right Arm)   Pulse 78   Temp 98.2 F (36.8 C) (Oral)   Resp 16   SpO2 98%   Physical Exam  Constitutional: She is oriented to person, place, and time. She appears well-developed and well-nourished. No distress.  HENT:  Mouth/Throat: Oropharynx is clear and moist.  Contusion to forehead. Lacerations to forehead, 4 cm.   Eyes: Pupils are equal, round, and reactive to light. Conjunctivae are normal. No scleral icterus.  Neck: No tracheal deviation present.  c collar.   Cardiovascular: Normal rate, regular rhythm, normal heart sounds and intact distal pulses.  Exam reveals no gallop and no friction rub.   No murmur heard. Pulmonary/Chest: Effort normal and breath sounds normal. No respiratory distress. She exhibits no tenderness.  Abdominal: Soft. Normal appearance and bowel sounds are normal. She exhibits no distension. There is no tenderness.  Musculoskeletal: She exhibits no edema.  Mid cervical tenderness, otherwise, CTLS spine, non tender, aligned,  no step off. Bruising and tenderness lateral aspect left hand, otherwise good rom bil ext without other pain or focal bony tenderness, distal pulses palp.   Neurological: She is alert and oriented to person, place, and time.  Speech clear/fluent. Ambulates w steady gait.   Skin: Skin is warm and dry. No rash noted. She is not diaphoretic.  Psychiatric: She has a normal mood and affect.  Nursing note and vitals reviewed.    ED Treatments / Results  Labs (all labs ordered are listed, but only abnormal results are displayed) Labs Reviewed - No data to display  EKG  EKG Interpretation None       Radiology Ct  Head Wo Contrast  Result Date: 06/12/2017 CLINICAL DATA:  Fall.  Laceration on bridge of nose.  Neck pain. EXAM: CT HEAD WITHOUT CONTRAST CT CERVICAL SPINE WITHOUT CONTRAST TECHNIQUE: Multidetector CT imaging of the head and cervical spine was performed following the standard protocol without intravenous contrast. Multiplanar CT image reconstructions of the cervical spine were also generated. COMPARISON:  None. FINDINGS: CT HEAD FINDINGS Brain: No acute intracranial abnormality. Specifically, no hemorrhage, hydrocephalus, mass lesion, acute infarction, or significant intracranial injury. Vascular: No hyperdense vessel or unexpected calcification. Skull: No acute calvarial abnormality. Sinuses/Orbits: Visualized paranasal sinuses and mastoids clear. Orbital soft tissues unremarkable. Other: None CT CERVICAL SPINE FINDINGS Alignment: Normal Skull base and vertebrae: No fracture Soft tissues and spinal canal: Prevertebral soft tissues are normal. No epidural or paraspinal hematoma. Disc levels: Diffuse scratched at degenerative disc disease changes at C5-6 and C6-7. Diffuse bilateral degenerative facet disease. Moderate bilateral multi level neural foraminal narrowing. Upper chest: Negative Other: Rim calcified nodules noted in the thyroid lobes bilaterally, right greater than left. Most  compatible multinodular goiter. IMPRESSION: No acute intracranial abnormality. Degenerative changes in the cervical spine. No acute bony abnormality. Electronically Signed   By: Charlett Nose M.D.   On: 06/12/2017 09:01   Ct Cervical Spine Wo Contrast  Result Date: 06/12/2017 CLINICAL DATA:  Fall.  Laceration on bridge of nose.  Neck pain. EXAM: CT HEAD WITHOUT CONTRAST CT CERVICAL SPINE WITHOUT CONTRAST TECHNIQUE: Multidetector CT imaging of the head and cervical spine was performed following the standard protocol without intravenous contrast. Multiplanar CT image reconstructions of the cervical spine were also generated. COMPARISON:  None. FINDINGS: CT HEAD FINDINGS Brain: No acute intracranial abnormality. Specifically, no hemorrhage, hydrocephalus, mass lesion, acute infarction, or significant intracranial injury. Vascular: No hyperdense vessel or unexpected calcification. Skull: No acute calvarial abnormality. Sinuses/Orbits: Visualized paranasal sinuses and mastoids clear. Orbital soft tissues unremarkable. Other: None CT CERVICAL SPINE FINDINGS Alignment: Normal Skull base and vertebrae: No fracture Soft tissues and spinal canal: Prevertebral soft tissues are normal. No epidural or paraspinal hematoma. Disc levels: Diffuse scratched at degenerative disc disease changes at C5-6 and C6-7. Diffuse bilateral degenerative facet disease. Moderate bilateral multi level neural foraminal narrowing. Upper chest: Negative Other: Rim calcified nodules noted in the thyroid lobes bilaterally, right greater than left. Most compatible multinodular goiter. IMPRESSION: No acute intracranial abnormality. Degenerative changes in the cervical spine. No acute bony abnormality. Electronically Signed   By: Charlett Nose M.D.   On: 06/12/2017 09:01   Dg Hand Complete Left  Result Date: 06/12/2017 CLINICAL DATA:  Left hand pain after fall. EXAM: LEFT HAND - COMPLETE 3+ VIEW COMPARISON:  None. FINDINGS: There is no evidence of  fracture or dislocation. Degenerative changes of the IP joints. Diffuse osteopenia. Soft tissues are unremarkable. IMPRESSION: No acute osseous abnormality. Electronically Signed   By: Obie Dredge M.D.   On: 06/12/2017 09:07    Procedures .Marland KitchenLaceration Repair Date/Time: 06/12/2017 9:54 AM Performed by: Cathren Laine Authorized by: Cathren Laine   Anesthesia (see MAR for exact dosages):    Anesthesia method:  Local infiltration   Local anesthetic:  Lidocaine 2% WITH epi Laceration details:    Location:  Face   Face location:  Forehead   Length (cm):  4   Depth (mm):  3 Pre-procedure details:    Preparation:  Patient was prepped and draped in usual sterile fashion Exploration:    Contaminated: no   Treatment:    Area cleansed with:  Betadine  Amount of cleaning:  Standard   Irrigation solution:  Sterile saline   Irrigation method:  Syringe   Visualized foreign bodies/material removed: no   Skin repair:    Repair method:  Sutures   Suture size:  6-0   Suture material:  Prolene   Suture technique:  Simple interrupted Approximation:    Vermilion border: well-aligned   Post-procedure details:    Dressing:  Antibiotic ointment   Patient tolerance of procedure:  Tolerated well, no immediate complications   (including critical care time)  Medications Ordered in ED Medications  lidocaine-EPINEPHrine-tetracaine (LET) solution (not administered)  lidocaine-EPINEPHrine (XYLOCAINE W/EPI) 2 %-1:100000 (with pres) injection 20 mL (not administered)     Initial Impression / Assessment and Plan / ED Course  I have reviewed the triage vital signs and the nursing notes.  Pertinent labs & imaging results that were available during my care of the patient were reviewed by me and considered in my medical decision making (see chart for details).  CT/imaging studies.  L.E.T. To lacs.  Wounds sutured.   Acetaminophen po.  Tetanus unknown.  Tetanus im.   Icepack.  Recheck spine  nt.  Discussed imaging w pt.  Pt appears stable for d/c.     Final Clinical Impressions(s) / ED Diagnoses   Final diagnoses:  None    New Prescriptions New Prescriptions   No medications on file     Cathren Laine, MD 06/12/17 [phone]

## 2017-06-12 NOTE — Discharge Instructions (Signed)
It was our pleasure to provide your ER care today - we hope that you feel better.  Keep your head elevated, and apply icepack to help with swelling and pain.  Take acetaminophen and/or ibuprofen as need for pain.  Have sutures removed, by your doctors office or urgent care, in 6-7 days.   Return to ER if worse, new symptoms, severe headache, persistent vomiting, new or severe pain, infection of wound, other concern.

## 2017-06-12 NOTE — ED Triage Notes (Signed)
Patient reports falling this morning by tripping over her kitty cat and hit her head on couch. Patient has approx 1 inch laceration on either side of upper nose.  Patient also c/o neck pain--applied c-collar in triage. Patient also has bruising and swelling to left hand. Patient denies LOC or taking blood thinners.

## 2017-06-12 NOTE — ED Notes (Signed)
Patient reports that she is in pain management and gets her refill tomorrow and doesn't want any pain medications while she is here due to messing that up.

## 2017-10-15 IMAGING — CT CT HEAD W/O CM
3 of 8 series · 14 of 47 positions shown, 17 images · non-contrast
Comparison: None.

CLINICAL DATA: Fall.  Laceration on bridge of nose.  Neck pain.

EXAM:
CT HEAD WITHOUT CONTRAST
CT CERVICAL SPINE WITHOUT CONTRAST
TECHNIQUE: Multidetector CT imaging of the head and cervical spine was
performed following the standard protocol without intravenous
contrast. Multiplanar CT image reconstructions of the cervical spine
were also generated.

[Series 6: coronal · coronal · 0.29mm/px · 3 of 74 slices shown]
[im 25/74  brain]
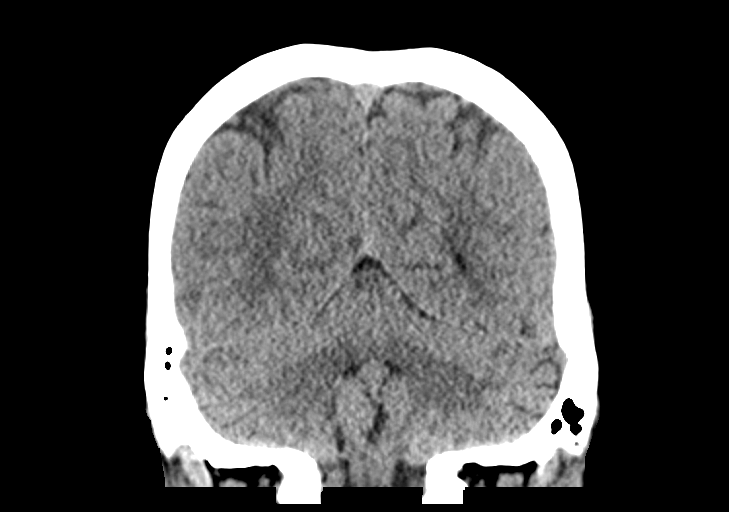
[im 37/74  brain]
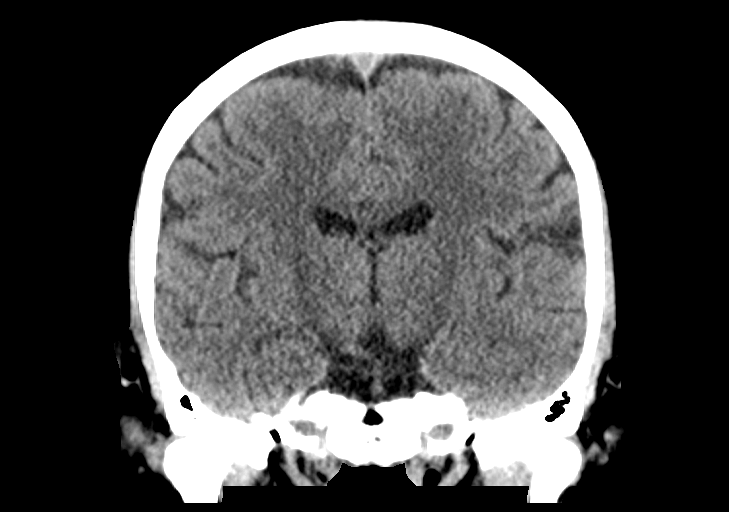
[im 49/74  brain]
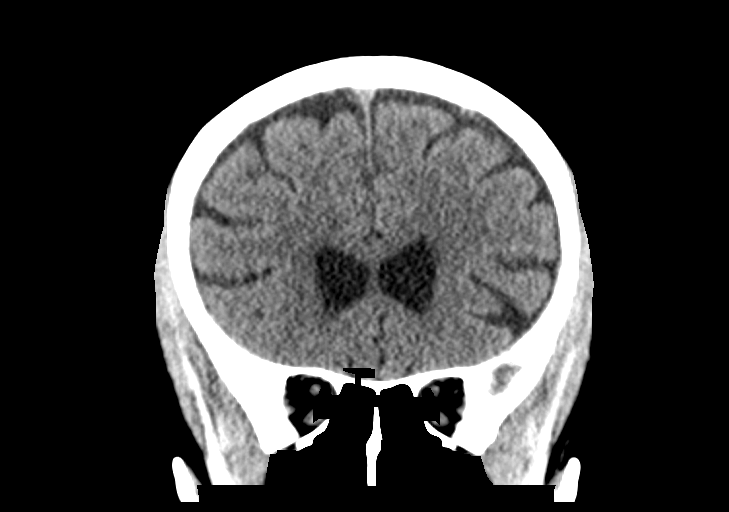

[Series 7: sagittal · sagittal · 0.29mm/px · 2 of 74 slices shown]
[im 25/74  brain]
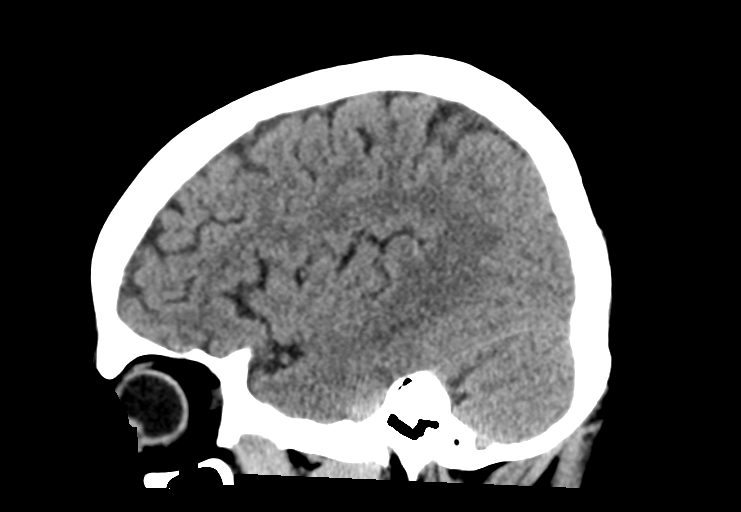
[im 49/74  brain]
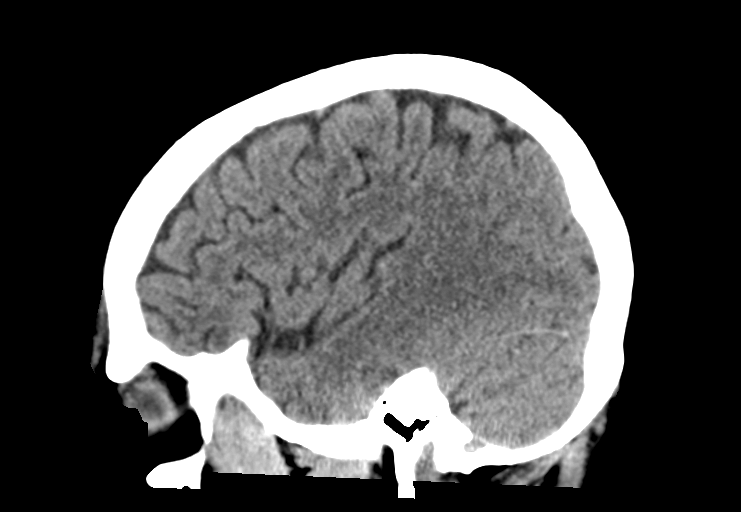

[Series 11: axial recon · axial · 0.23mm/px · z∈[-231,-91]mm · 9 of 98 slices shown, 12 images]
[im 10/98  brain]
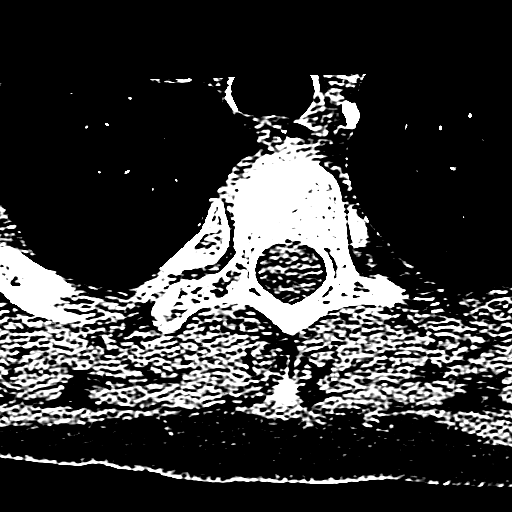
[im 10/98  bone]
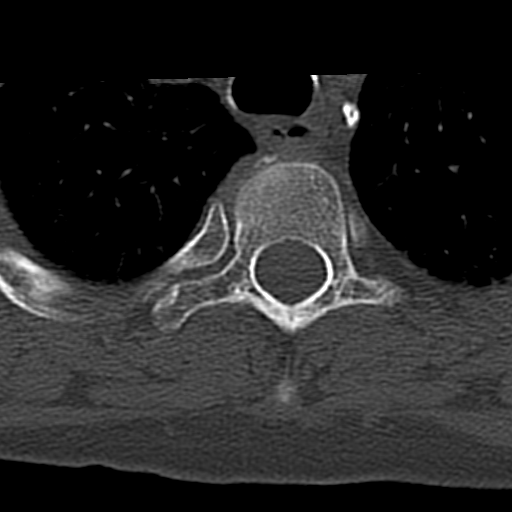
[im 20/98  brain]
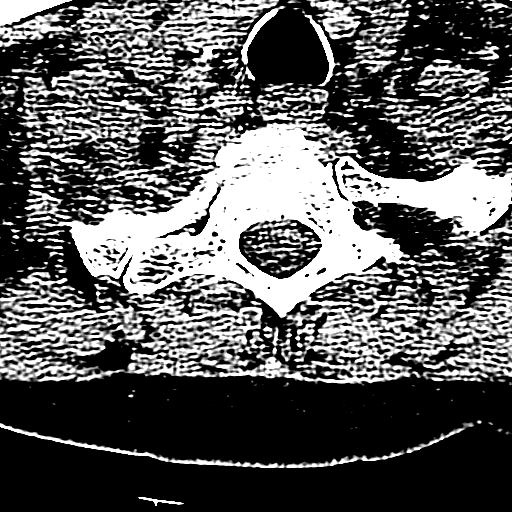
[im 30/98  brain]
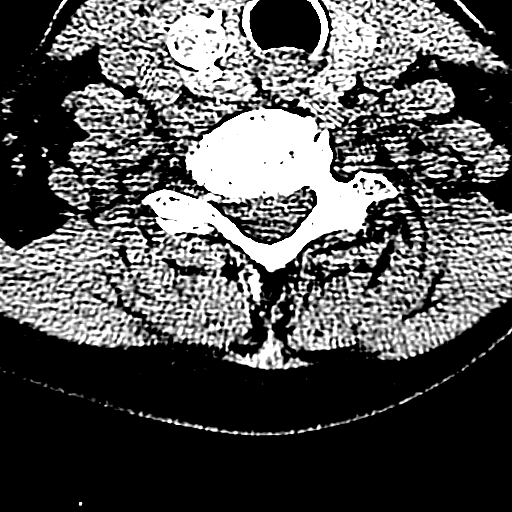
[im 39/98  brain]
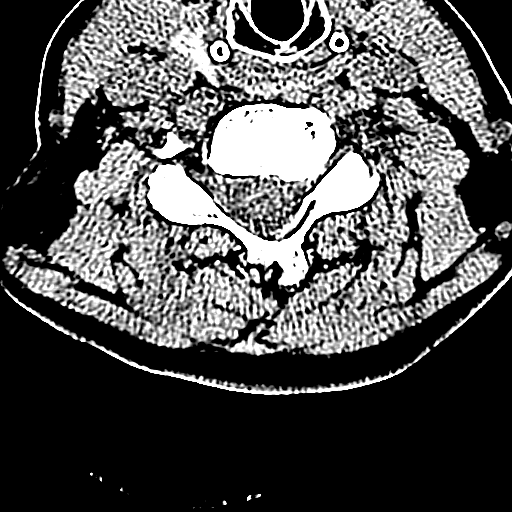
[im 49/98  brain]
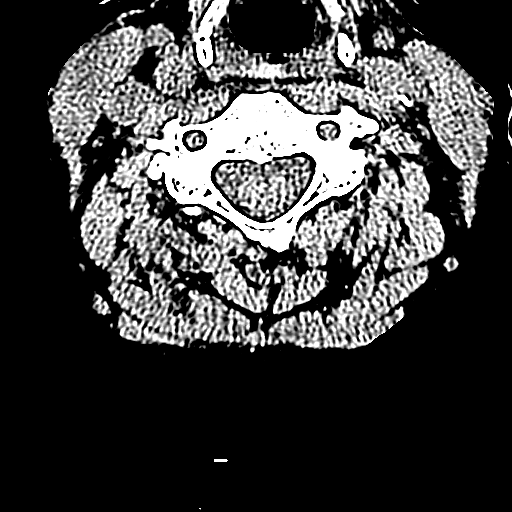
[im 49/98  bone]
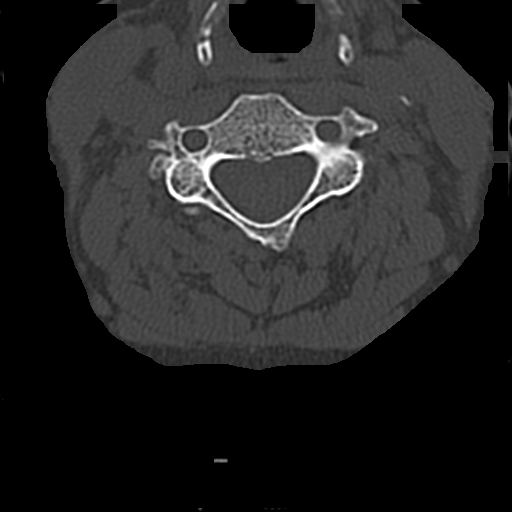
[im 59/98  brain]
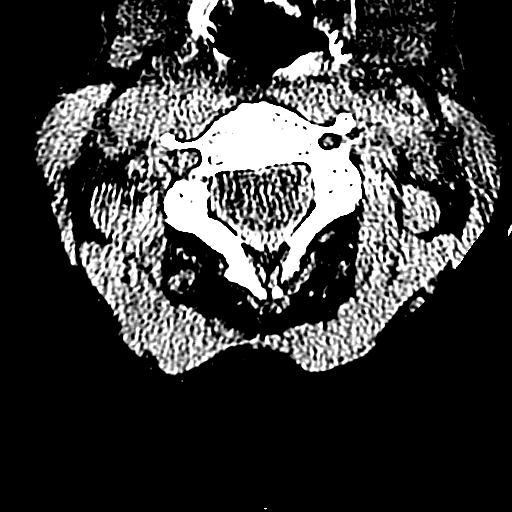
[im 68/98  brain]
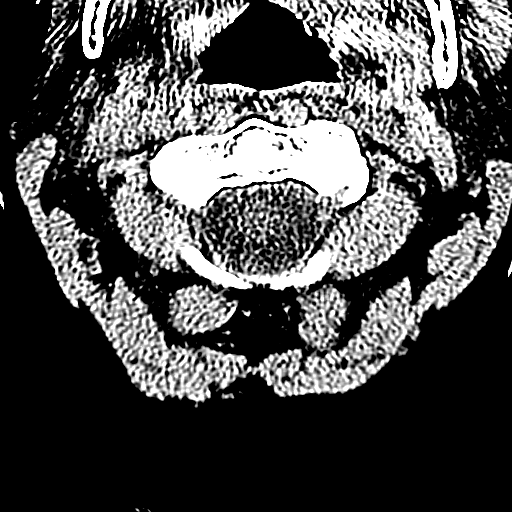
[im 78/98  brain]
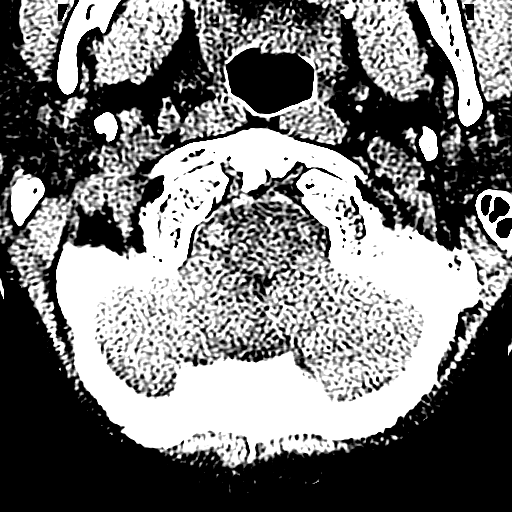
[im 88/98  brain]
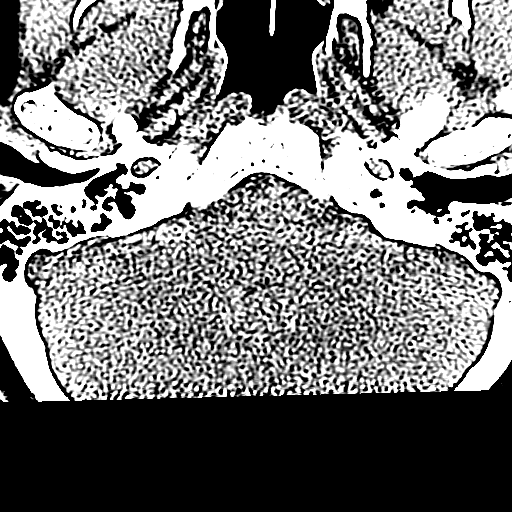
[im 88/98  bone]
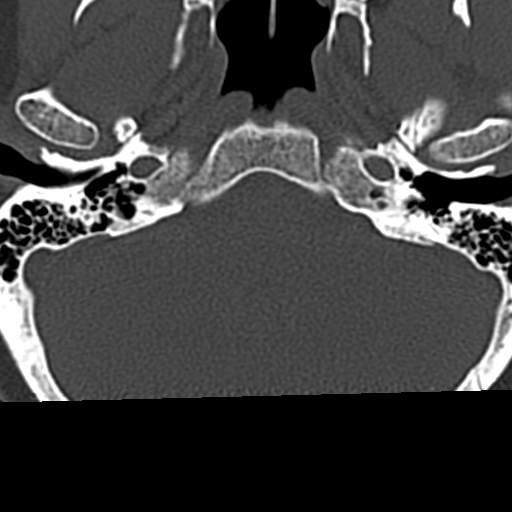

[14 of 47 positions shown; findings below may reference images not displayed]

FINDINGS: CT HEAD FINDINGS

Brain: No acute intracranial abnormality. Specifically, no
hemorrhage, hydrocephalus, mass lesion, acute infarction, or
significant intracranial injury.

Vascular: No hyperdense vessel or unexpected calcification.

Skull: No acute calvarial abnormality.

Sinuses/Orbits: Visualized paranasal sinuses and mastoids clear.
Orbital soft tissues unremarkable.

Other: None

CT CERVICAL SPINE FINDINGS

Alignment: Normal

Skull base and vertebrae: No fracture

Soft tissues and spinal canal: Prevertebral soft tissues are normal.
No epidural or paraspinal hematoma.

Disc levels: Diffuse scratched at degenerative disc disease changes
at C5-6 and C6-7. Diffuse bilateral degenerative facet disease.
Moderate bilateral multi level neural foraminal narrowing.

Upper chest: Negative

Other: Rim calcified nodules noted in the thyroid lobes bilaterally,
right greater than left. Most compatible multinodular goiter.
IMPRESSION: No acute intracranial abnormality.

Degenerative changes in the cervical spine. No acute bony
abnormality.

## 2017-12-07 ENCOUNTER — Other Ambulatory Visit: Payer: Self-pay

## 2017-12-07 ENCOUNTER — Encounter (HOSPITAL_COMMUNITY): Payer: Self-pay

## 2017-12-07 DIAGNOSIS — Z79899 Other long term (current) drug therapy: Secondary | ICD-10-CM | POA: Diagnosis not present

## 2017-12-07 DIAGNOSIS — Z91040 Latex allergy status: Secondary | ICD-10-CM | POA: Insufficient documentation

## 2017-12-07 DIAGNOSIS — R040 Epistaxis: Secondary | ICD-10-CM | POA: Insufficient documentation

## 2017-12-07 DIAGNOSIS — I10 Essential (primary) hypertension: Secondary | ICD-10-CM | POA: Diagnosis not present

## 2017-12-07 DIAGNOSIS — Z87891 Personal history of nicotine dependence: Secondary | ICD-10-CM | POA: Insufficient documentation

## 2017-12-07 DIAGNOSIS — Z96643 Presence of artificial hip joint, bilateral: Secondary | ICD-10-CM | POA: Insufficient documentation

## 2017-12-07 MED ORDER — OXYMETAZOLINE HCL 0.05 % NA SOLN
1.0000 | Freq: Once | NASAL | Status: AC
  Administered 2017-12-07: 1 via NASAL
  Filled 2017-12-07: qty 15

## 2017-12-07 NOTE — ED Notes (Addendum)
Pt's left nare began bleeding around 22:00 after picking at a scab. She has been having nose bleeds for the past three months, but this is the first time that she couldn't control it at home. Blood clots are visible when she blows her nose.

## 2017-12-07 NOTE — ED Triage Notes (Signed)
Pt reports epistaxis x1 hour. Denies blood thinner use. A&Ox4. No injury or history of same.

## 2017-12-08 ENCOUNTER — Emergency Department (HOSPITAL_COMMUNITY)
Admission: EM | Admit: 2017-12-08 | Discharge: 2017-12-08 | Disposition: A | Discharge status: Home / Self Care | Payer: Medicare Other | Attending: Emergency Medicine | Admitting: Emergency Medicine

## 2017-12-08 DIAGNOSIS — R040 Epistaxis: Secondary | ICD-10-CM

## 2017-12-08 MED ORDER — DOXYCYCLINE HYCLATE 100 MG PO CAPS: 100.0000 mg | ORAL_CAPSULE | Freq: Two times a day (BID) | ORAL | 0 refills | Status: DC

## 2017-12-08 NOTE — Discharge Instructions (Signed)
Remove packing in 48 hours.  See your primary care doctor for this as we discussed

## 2017-12-08 NOTE — ED Provider Notes (Signed)
[facility]-EMERGENCY DEPT Provider Note   CSN: [id number] Arrival date & time: 12/07/17  2306     History   Chief Complaint Chief Complaint  Patient presents with  . Epistaxis    HPI Kaitlin Little is a 72 y.o. female.  72 year old female presents with left-sided nosebleed times 1 day.  Notes that she has been and her home with a he is been very dry.  Uses aspirin but denies taking any other blood thinners.  No prior history of severe epistaxis.  Denies any headache.  Using direct pressure with transient results.  Denies any severe drainage in back of her throat.        Past Medical History:  Diagnosis Date  . Anemia    bleeding ulcers no blood transfusions  . Arthritis   . Depression   . GERD (gastroesophageal reflux disease)   . Headache    sinus headaches  . History of hiatal hernia   . Hypertension   . Hyperthyroidism    benign nodules in neck no meds    Patient Active Problem List   Diagnosis Date Noted  . Status post total replacement of right hip 04/27/2017  . Pain in right hip 01/08/2017  . Unilateral primary osteoarthritis, right hip 01/08/2017    Past Surgical History:  Procedure Laterality Date  . ABDOMINAL HYSTERECTOMY     partial  . BACK SURGERY     lower  . BREAST SURGERY     lumpectomy bil breast  . FOOT SURGERY     bil bunions and straightening great toe  . JOINT REPLACEMENT     Left hip right hip 04/27/17 Dr. Salena Saner blackman  . knee arthroscopy     left  . TOTAL HIP ARTHROPLASTY Right 04/27/2017   Procedure: RIGHT TOTAL HIP ARTHROPLASTY ANTERIOR APPROACH;  Surgeon: Kathryne Hitch, MD;  Location: [facility];  Service: Orthopedics;  Laterality: Right;    OB History    No data available       Home Medications    Prior to Admission medications   Medication Sig Start Date End Date Taking? Authorizing Provider  amLODipine (NORVASC) 5 MG tablet Take 5 mg by mouth daily. 11/05/15   [provider]  aspirin 81  MG chewable tablet Chew 1 tablet (81 mg total) by mouth 2 (two) times daily. Patient not taking: Reported on 06/12/2017 04/30/17   Kathryne Hitch, MD  Biotin 10 MG CAPS Take 10 mg by mouth 2 (two) times a week.     [provider]  Cholecalciferol (VITAMIN D) 2000 units CAPS Take 2,000 Units by mouth daily.    [provider]  Coenzyme Q10 200 MG capsule Take 200 mg by mouth daily.    [provider]  DULoxetine (CYMBALTA) 60 MG capsule Take 60 mg by mouth daily. 01/05/16   [provider]  esomeprazole (NEXIUM) 40 MG capsule Take 40 mg by mouth daily before breakfast. 02/10/17   [provider]  HYDROmorphone (DILAUDID) 2 MG tablet Take 1 tablet (2 mg total) by mouth every 4 (four) hours as needed for severe pain. Patient not taking: Reported on 06/11/2017 05/03/17   Kathryne Hitch, MD  Menthol (COOL N HEAT/BACK) 5 % [facility] 1 patch onto the skin daily as needed (for pain.).    [provider]  methocarbamol (ROBAXIN) 500 MG tablet Take 1 tablet (500 mg total) by mouth every 6 (six) hours as needed for muscle spasms. Patient not taking: Reported  on 06/12/2017 04/30/17   Kathryne Hitch, MD  MOVANTIK 25 MG TABS tablet Take 25 mg by mouth daily. 01/17/16   [provider]  Multiple Vitamin (MULTIVITAMIN WITH MINERALS) TABS tablet Take 1 tablet by mouth daily.    [provider]  oxyCODONE-acetaminophen (PERCOCET) 10-325 MG tablet Take 1 tablet by mouth every 6 (six) hours as needed for pain. 04/30/17   Kathryne Hitch, MD  simvastatin (ZOCOR) 10 MG tablet Take 10 mg by mouth every evening.  12/11/15   [provider]    Family History History reviewed. No pertinent family history.  Social History Social History   Tobacco Use  . Smoking status: Former Smoker    Packs/day: 0.50    Types: Cigarettes  . Smokeless tobacco: Never Used  . Tobacco comment: 40 years  Substance Use Topics  .  Alcohol use: Yes    Alcohol/week: 0.6 oz    Types: 1 Glasses of wine per week    Comment: 4x /month  . Drug use: No     Allergies   Adhesive [tape]; Ampicillin; and Latex   Review of Systems Review of Systems  All other systems reviewed and are negative.    Physical Exam Updated Vital Signs BP (!) 145/105 (BP Location: Left Arm)   Pulse (!) 109   Temp 98.1 F (36.7 C) (Oral)   Resp 20   Ht 1.626 m (5\' 4" )   Wt 63.5 kg (140 lb)   SpO2 95%   BMI 24.03 kg/m   Physical Exam  Constitutional: She is oriented to person, place, and time. She appears well-developed and well-nourished.  Non-toxic appearance. No distress.  HENT:  Head: Normocephalic and atraumatic.  Nose: No epistaxis.  No foreign bodies.    Eyes: Conjunctivae, EOM and lids are normal. Pupils are equal, round, and reactive to light.  Neck: Normal range of motion. Neck supple. No tracheal deviation present. No thyroid mass present.  Cardiovascular: Normal rate, regular rhythm and normal heart sounds. Exam reveals no gallop.  No murmur heard. Pulmonary/Chest: Effort normal and breath sounds normal. No stridor. No respiratory distress. She has no decreased breath sounds. She has no wheezes. She has no rhonchi. She has no rales.  Abdominal: Soft. Normal appearance and bowel sounds are normal. She exhibits no distension. There is no tenderness. There is no rebound and no CVA tenderness.  Musculoskeletal: Normal range of motion. She exhibits no edema or tenderness.  Neurological: She is alert and oriented to person, place, and time. She has normal strength. No cranial nerve deficit or sensory deficit. GCS eye subscore is 4. GCS verbal subscore is 5. GCS motor subscore is 6.  Skin: Skin is warm and dry. No abrasion and no rash noted.  Psychiatric: She has a normal mood and affect. Her speech is normal and behavior is normal.  Nursing note and vitals reviewed.    ED Treatments / Results  Labs (all labs ordered are  listed, but only abnormal results are displayed) Labs Reviewed - No data to display  EKG  EKG Interpretation None       Radiology No results found.  Procedures .Epistaxis Management Date/Time: 12/08/2017 2:14 AM Performed by: Lorre Nick, MD Authorized by: Lorre Nick, MD   Consent:    Consent obtained:  Verbal   Consent given by:  Patient   Risks discussed:  Bleeding   Alternatives discussed:  No treatment Anesthesia (see MAR for exact dosages):    Anesthesia method:  None Procedure  details:    Treatment site:  L posterior and L anterior   Treatment method:  Nasal tampon   Treatment complexity:  Extensive   Treatment episode: recurring   Post-procedure details:    Assessment:  Bleeding decreased   Patient tolerance of procedure:  Tolerated well, no immediate complications   (including critical care time)  Medications Ordered in ED Medications  oxymetazoline (AFRIN) 0.05 % nasal spray 1 spray (not administered)     Initial Impression / Assessment and Plan / ED Course  I have reviewed the triage vital signs and the nursing notes.  Pertinent labs & imaging results that were available during my care of the patient were reviewed by me and considered in my medical decision making (see chart for details).      Past initially with an anterior nasal tampon.  Bleeding continued and a posterior one was placed.  Bleeding has been controlled at this time.  Will place on antibiotics and instructed to have packing removed by her doctor in 48 hours.  Final Clinical Impressions(s) / ED Diagnoses   Final diagnoses:  None    ED Discharge Orders    None       Lorre NickAllen, Debie Ashline, MD 12/08/17 [phone]

## 2017-12-10 ENCOUNTER — Ambulatory Visit (INDEPENDENT_AMBULATORY_CARE_PROVIDER_SITE_OTHER): Payer: Medicare Other | Admitting: Orthopaedic Surgery

## 2018-01-07 ENCOUNTER — Ambulatory Visit (INDEPENDENT_AMBULATORY_CARE_PROVIDER_SITE_OTHER): Payer: Medicare Other | Admitting: Orthopaedic Surgery

## 2018-01-14 ENCOUNTER — Ambulatory Visit (INDEPENDENT_AMBULATORY_CARE_PROVIDER_SITE_OTHER): Payer: Medicare Other | Admitting: Orthopaedic Surgery

## 2018-01-21 ENCOUNTER — Ambulatory Visit (INDEPENDENT_AMBULATORY_CARE_PROVIDER_SITE_OTHER): Payer: Medicare Other | Admitting: Orthopaedic Surgery

## 2018-01-21 ENCOUNTER — Ambulatory Visit (INDEPENDENT_AMBULATORY_CARE_PROVIDER_SITE_OTHER): Payer: Medicare Other

## 2018-01-21 ENCOUNTER — Encounter (INDEPENDENT_AMBULATORY_CARE_PROVIDER_SITE_OTHER): Payer: Self-pay | Admitting: Orthopaedic Surgery

## 2018-01-21 DIAGNOSIS — Z96641 Presence of right artificial hip joint: Secondary | ICD-10-CM

## 2018-01-21 NOTE — Progress Notes (Signed)
The patient is now 9 months status post a right total hip arthroplasty performed through direct anterior approach.  Her left hip was replaced elsewhere.  She says now she is walking more balance and has no issues with either hip at all.  She is walking without a limp.  Leg lengths feel good.  She tolerates me putting both hips to internal and external rotation without any difficulties or issues at all.  Both incisions of healed nicely.  A low AP pelvis shows a well-seated implants bilaterally with no acute findings or complicating features.  At this point she will follow-up as needed.  All questions concerns were answered and addressed.

## 2018-07-22 ENCOUNTER — Other Ambulatory Visit: Payer: Self-pay | Admitting: Internal Medicine

## 2018-07-22 DIAGNOSIS — Z1231 Encounter for screening mammogram for malignant neoplasm of breast: Secondary | ICD-10-CM

## 2019-04-29 ENCOUNTER — Other Ambulatory Visit: Payer: Self-pay | Admitting: Pain Medicine

## 2019-04-29 DIAGNOSIS — M79651 Pain in right thigh: Secondary | ICD-10-CM

## 2019-04-29 DIAGNOSIS — M545 Low back pain, unspecified: Secondary | ICD-10-CM

## 2019-06-16 ENCOUNTER — Other Ambulatory Visit: Payer: Federal, State, Local not specified - PPO

## 2019-06-21 ENCOUNTER — Encounter (HOSPITAL_COMMUNITY): Payer: Self-pay

## 2019-06-21 ENCOUNTER — Emergency Department (HOSPITAL_COMMUNITY)
Admission: EM | Admit: 2019-06-21 | Discharge: 2019-06-21 | Disposition: A | Discharge status: Home / Self Care | Payer: Medicare Other | Attending: Emergency Medicine | Admitting: Emergency Medicine

## 2019-06-21 ENCOUNTER — Emergency Department (HOSPITAL_COMMUNITY): Payer: Medicare Other

## 2019-06-21 ENCOUNTER — Other Ambulatory Visit: Payer: Self-pay

## 2019-06-21 DIAGNOSIS — W19XXXA Unspecified fall, initial encounter: Secondary | ICD-10-CM | POA: Diagnosis not present

## 2019-06-21 DIAGNOSIS — S0083XA Contusion of other part of head, initial encounter: Secondary | ICD-10-CM

## 2019-06-21 DIAGNOSIS — Y939 Activity, unspecified: Secondary | ICD-10-CM | POA: Insufficient documentation

## 2019-06-21 DIAGNOSIS — Z79899 Other long term (current) drug therapy: Secondary | ICD-10-CM | POA: Diagnosis not present

## 2019-06-21 DIAGNOSIS — I10 Essential (primary) hypertension: Secondary | ICD-10-CM | POA: Diagnosis not present

## 2019-06-21 DIAGNOSIS — S0011XA Contusion of right eyelid and periocular area, initial encounter: Secondary | ICD-10-CM | POA: Insufficient documentation

## 2019-06-21 DIAGNOSIS — S0993XA Unspecified injury of face, initial encounter: Secondary | ICD-10-CM | POA: Diagnosis present

## 2019-06-21 DIAGNOSIS — R55 Syncope and collapse: Secondary | ICD-10-CM | POA: Diagnosis not present

## 2019-06-21 DIAGNOSIS — Z91040 Latex allergy status: Secondary | ICD-10-CM | POA: Diagnosis not present

## 2019-06-21 DIAGNOSIS — Z96641 Presence of right artificial hip joint: Secondary | ICD-10-CM | POA: Diagnosis not present

## 2019-06-21 DIAGNOSIS — Y929 Unspecified place or not applicable: Secondary | ICD-10-CM | POA: Diagnosis not present

## 2019-06-21 DIAGNOSIS — Y999 Unspecified external cause status: Secondary | ICD-10-CM | POA: Insufficient documentation

## 2019-06-21 DIAGNOSIS — Z87891 Personal history of nicotine dependence: Secondary | ICD-10-CM | POA: Insufficient documentation

## 2019-06-21 LAB — BASIC METABOLIC PANEL
Anion gap: 14 (ref 5–15)
BUN: 13 mg/dL (ref 8–23)
CO2: 28 mmol/L (ref 22–32)
Calcium: 10.5 mg/dL — ABNORMAL HIGH (ref 8.9–10.3)
Chloride: 98 mmol/L (ref 98–111)
Creatinine, Ser: 0.83 mg/dL (ref 0.44–1.00)
GFR calc Af Amer: >60 mL/min (ref >60)
GFR calc non Af Amer: >60 mL/min (ref >60)
Glucose, Bld: 106 mg/dL — ABNORMAL HIGH (ref 70–99)
Potassium: 4.3 mmol/L (ref 3.5–5.1)
Sodium: 140 mmol/L (ref 135–145)

## 2019-06-21 LAB — URINALYSIS, ROUTINE W REFLEX MICROSCOPIC
Bilirubin Urine: NEGATIVE
Glucose, UA: NEGATIVE mg/dL
Hgb urine dipstick: NEGATIVE
Ketones, ur: 5 mg/dL — AB
Nitrite: NEGATIVE
Protein, ur: NEGATIVE mg/dL
Specific Gravity, Urine: 1.009 (ref 1.005–1.030)
pH: 6 (ref 5.0–8.0)

## 2019-06-21 LAB — CBC
HCT: 38.9 % (ref 36.0–46.0)
Hemoglobin: 12.6 g/dL (ref 12.0–15.0)
MCH: 32.2 pg (ref 26.0–34.0)
MCHC: 32.4 g/dL (ref 30.0–36.0)
MCV: 99.5 fL (ref 80.0–100.0)
Platelets: 295 10*3/uL (ref 150–400)
RBC: 3.91 MIL/uL (ref 3.87–5.11)
RDW: 13.2 % (ref 11.5–15.5)
WBC: 6.5 10*3/uL (ref 4.0–10.5)
nRBC: 0 % (ref 0.0–0.2)

## 2019-06-21 LAB — CK: Total CK: 126 U/L (ref 38–234)

## 2019-06-21 MED ORDER — SODIUM CHLORIDE 0.9% FLUSH: 3.0000 mL | Freq: Once | INTRAVENOUS | Status: DC

## 2019-06-21 NOTE — ED Notes (Signed)
Patient made aware to call out once able to provide UA.

## 2019-06-21 NOTE — ED Notes (Signed)
Patient ambulated to restroom with no assistance and problems with gate.

## 2019-06-21 NOTE — Discharge Instructions (Signed)
You will need to follow-up with your primary doctor as soon as possible.  Your testing here today did not show any significant abnormalities.  You will need to increase your fluid intake rest as much as possible.

## 2019-06-21 NOTE — ED Triage Notes (Signed)
Pt states that 4 days ago, she felt herself beginning to fall in her living room. Pt states that she doesn't remember the incident itself. Pt states she also doesn't know how she made it to bed that night. Pt states she woke up the next morning with a bruise on her right eye. Pt also has bruising on her right shoulder blade area, and near her bra strap on the back. Pt states that she is still very achy in her right arm. Pt states that she has been having headaches as well. Pt is NAD at this time.

## 2019-06-21 NOTE — ED Provider Notes (Signed)
[facility]-EMERGENCY DEPT Provider Note   CSN: [id number] Arrival date & time: 06/21/19  1527     History   Chief Complaint Chief Complaint  Patient presents with   Fall   Loss of Consciousness    HPI Kaitlin Little is a 73 y.o. female.     HPI Patient presents to the emergency department with an episode where she fell 4 nights ago and states that she lost consciousness.  Patient states that she is unsure if she syncopized or hit her head and lost consciousness.  The patient states she tripped falling forward and this last thing she remembers.  Patient states that she has not had any symptoms since that time.  The patient denies chest pain, shortness of breath, headache,blurred vision, neck pain, fever, cough, weakness, numbness, dizziness, anorexia, edema, abdominal pain, nausea, vomiting, diarrhea, rash, back pain, dysuria, hematemesis, bloody stool.  Past Medical History:  Diagnosis Date   Anemia    bleeding ulcers no blood transfusions   Arthritis    Depression    GERD (gastroesophageal reflux disease)    Headache    sinus headaches   History of hiatal hernia    Hypertension    Hyperthyroidism    benign nodules in neck no meds    Patient Active Problem List   Diagnosis Date Noted   Status post total replacement of right hip 04/27/2017   Pain in right hip 01/08/2017   Unilateral primary osteoarthritis, right hip 01/08/2017    Past Surgical History:  Procedure Laterality Date   ABDOMINAL HYSTERECTOMY     partial   BACK SURGERY     lower   BREAST SURGERY     lumpectomy bil breast   FOOT SURGERY     bil bunions and straightening great toe   JOINT REPLACEMENT     Left hip right hip 04/27/17 Dr. Salena Saner blackman   knee arthroscopy     left   TOTAL HIP ARTHROPLASTY Right 04/27/2017   Procedure: RIGHT TOTAL HIP ARTHROPLASTY ANTERIOR APPROACH;  Surgeon: Kathryne HitchBlackman, Bearett Porcaro Y, MD;  Location: [facility];  Service: Orthopedics;   Laterality: Right;     OB History   No obstetric history on file.      Home Medications    Prior to Admission medications   Medication Sig Start Date End Date Taking? Authorizing Provider  amLODipine (NORVASC) 5 MG tablet Take 5 mg by mouth daily. 11/05/15  Yes [provider]  aspirin-acetaminophen-caffeine (EXCEDRIN MIGRAINE) [id number] MG tablet Take 1 tablet by mouth every 6 (six) hours as needed for headache.   Yes [provider]  Biotin 10 MG CAPS Take 10 mg by mouth 2 (two) times a week.    Yes [provider]  Cholecalciferol (VITAMIN D) 2000 units CAPS Take 2,000 Units by mouth daily.   Yes [provider]  Coenzyme Q10 200 MG capsule Take 200 mg by mouth daily.   Yes [provider]  DULoxetine (CYMBALTA) 60 MG capsule Take 60 mg by mouth daily. 01/05/16  Yes [provider]  esomeprazole (NEXIUM) 40 MG capsule Take 40 mg by mouth daily before breakfast. 02/10/17  Yes [provider]  Menthol (COOL N HEAT/BACK) 5 % [facility] Place 1 patch onto the skin daily as needed (for pain.).   Yes [provider]  MOVANTIK 25 MG TABS tablet Take 25 mg by mouth daily. 01/17/16  Yes [provider]  Multiple Vitamin (MULTIVITAMIN WITH MINERALS) TABS tablet Take 1 tablet by  mouth daily.   Yes [provider]  oxyCODONE-acetaminophen (PERCOCET) 10-325 MG tablet Take 1 tablet by mouth every 6 (six) hours as needed for pain. 04/30/17  Yes Kathryne Hitch, MD  simvastatin (ZOCOR) 10 MG tablet Take 10 mg by mouth every evening.  12/11/15  Yes [provider]  zolpidem (AMBIEN) 10 MG tablet Take 10 mg by mouth at bedtime as needed for sleep.   Yes [provider]  aspirin 81 MG chewable tablet Chew 1 tablet (81 mg total) by mouth 2 (two) times daily. Patient not taking: Reported on 06/12/2017 04/30/17   Kathryne Hitch, MD  doxycycline (VIBRAMYCIN) 100 MG capsule Take 1 capsule (100 mg  total) by mouth 2 (two) times daily. Patient not taking: Reported on 06/21/2019 12/08/17   Lorre Nick, MD  HYDROmorphone (DILAUDID) 2 MG tablet Take 1 tablet (2 mg total) by mouth every 4 (four) hours as needed for severe pain. Patient not taking: Reported on 06/11/2017 05/03/17   Kathryne Hitch, MD  methocarbamol (ROBAXIN) 500 MG tablet Take 1 tablet (500 mg total) by mouth every 6 (six) hours as needed for muscle spasms. Patient not taking: Reported on 06/12/2017 04/30/17   Kathryne Hitch, MD    Family History No family history on file.  Social History Social History   Tobacco Use   Smoking status: Former Smoker    Packs/day: 0.50    Types: Cigarettes   Smokeless tobacco: Never Used   Tobacco comment: 40 years  Substance Use Topics   Alcohol use: Yes    Alcohol/week: 1.0 standard drinks    Types: 1 Glasses of wine per week    Comment: 4x /month   Drug use: No     Allergies   Adhesive [tape], Ampicillin, and Latex   Review of Systems Review of Systems All other systems negative except as documented in the HPI. All pertinent positives and negatives as reviewed in the HPI.  Physical Exam Updated Vital Signs BP (!) 166/76    Pulse 63    Temp 98.5 F (36.9 C) (Oral)    Resp 17    Ht 5\' 3"  (1.6 m)    Wt 65.8 kg    SpO2 99%    BMI 25.69 kg/m   Physical Exam Vitals signs and nursing note reviewed.  Constitutional:      General: She is not in acute distress.    Appearance: She is well-developed.  HENT:     Head: Normocephalic and atraumatic.  Eyes:     Pupils: Pupils are equal, round, and reactive to light.  Neck:     Musculoskeletal: Normal range of motion and neck supple.  Cardiovascular:     Rate and Rhythm: Normal rate and regular rhythm.     Heart sounds: Normal heart sounds. No murmur. No friction rub. No gallop.   Pulmonary:     Effort: Pulmonary effort is normal. No respiratory distress.     Breath sounds: Normal breath sounds. No  wheezing.  Abdominal:     General: Bowel sounds are normal. There is no distension.     Palpations: Abdomen is soft.     Tenderness: There is no abdominal tenderness.  Skin:    General: Skin is warm and dry.     Capillary Refill: Capillary refill takes less than 2 seconds.     Findings: No erythema or rash.  Neurological:     General: No focal deficit present.     Mental Status: She is  alert and oriented to person, place, and time.     Sensory: No sensory deficit.     Motor: No weakness or abnormal muscle tone.     Coordination: Coordination normal.     Gait: Gait normal.  Psychiatric:        Mood and Affect: Mood normal.        Behavior: Behavior normal.        Thought Content: Thought content normal.        Judgment: Judgment normal.      ED Treatments / Results  Labs (all labs ordered are listed, but only abnormal results are displayed) Labs Reviewed  BASIC METABOLIC PANEL - Abnormal; Notable for the following components:      Result Value   Glucose, Bld 106 (*)    Calcium 10.5 (*)    All other components within normal limits  URINALYSIS, ROUTINE W REFLEX MICROSCOPIC - Abnormal; Notable for the following components:   Color, Urine STRAW (*)    Ketones, ur 5 (*)    Leukocytes,Ua MODERATE (*)    Bacteria, UA RARE (*)    All other components within normal limits  CBC  CK    EKG None  Radiology Ct Head Wo Contrast  Result Date: 06/21/2019 CLINICAL DATA:  Headache, posttraumatic. Headache status post fall. EXAM: CT HEAD WITHOUT CONTRAST TECHNIQUE: Contiguous axial images were obtained from the base of the skull through the vertex without intravenous contrast. COMPARISON:  Head CT dated 06/12/2017. FINDINGS: Brain: Ventricles are stable in size and configuration. Minimal chronic small vessel ischemic change within the bilateral periventricular white matter regions. No mass, hemorrhage, edema or other evidence of acute parenchymal abnormality. No extra-axial hemorrhage.  Vascular: Chronic calcified atherosclerotic changes of the large vessels at the skull base. No unexpected hyperdense vessel. Skull: Normal. Negative for fracture or focal lesion. Sinuses/Orbits: No acute finding. Other: Soft tissue edema overlying the RIGHT orbital roof. No underlying fracture seen. IMPRESSION: 1. No intracranial mass, hemorrhage or edema.  No skull fracture. 2. Soft tissue edema overlying the RIGHT orbital roof and lower RIGHT frontal bone. No underlying fracture seen. Electronically Signed   By: Franki Cabot M.D.   On: 06/21/2019 16:18    Procedures Procedures (including critical care time)  Medications Ordered in ED Medications  sodium chloride flush (NS) 0.9 % injection 3 mL (3 mLs Intravenous Not Given 06/21/19 1659)     Initial Impression / Assessment and Plan / ED Course  I have reviewed the triage vital signs and the nursing notes.  Pertinent labs & imaging results that were available during my care of the patient were reviewed by me and considered in my medical decision making (see chart for details).        The patient is either had a syncopal episode or mechanical fall that she had her head and lost consciousness.  The patient does have a area around the right eye that is bruised and contused.  The patient has been stable here in the emergency department.  Patient has been dealing with this since 4 days ago so I feel that the test of time is sure our advantage and hers.  The patient ambulated here in the department without any significant abnormalities.  She is demanding to be discharged home.  Patient is advised the need to return here for any worsening in her condition.  Also advised her to follow-up with her primary doctor.  Final Clinical Impressions(s) / ED Diagnoses   Final diagnoses:  None    ED Discharge Orders    None       Charlestine NightLawyer, Shaya Altamura, PA-C 06/21/19 1942    Samuel JesterMcManus, Kathleen, DO 06/22/19 1629

## 2019-07-05 ENCOUNTER — Other Ambulatory Visit: Payer: Self-pay

## 2019-07-05 ENCOUNTER — Ambulatory Visit
Admission: RE | Admit: 2019-07-05 | Discharge: 2019-07-05 | Disposition: A | Discharge status: Home / Self Care | Payer: Medicare Other | Source: Ambulatory Visit | Attending: Pain Medicine | Admitting: Pain Medicine

## 2019-07-05 DIAGNOSIS — M79651 Pain in right thigh: Secondary | ICD-10-CM

## 2019-07-05 DIAGNOSIS — M545 Low back pain, unspecified: Secondary | ICD-10-CM

## 2021-06-17 ENCOUNTER — Other Ambulatory Visit: Payer: Self-pay

## 2021-06-17 ENCOUNTER — Encounter (HOSPITAL_COMMUNITY): Payer: Self-pay

## 2021-06-17 ENCOUNTER — Emergency Department (HOSPITAL_COMMUNITY): Payer: Medicare Other

## 2021-06-17 ENCOUNTER — Observation Stay (HOSPITAL_COMMUNITY)
Admission: EM | Admit: 2021-06-17 | Discharge: 2021-06-18 | Disposition: A | Discharge status: Home / Self Care | Payer: Medicare Other | Attending: Internal Medicine | Admitting: Internal Medicine

## 2021-06-17 ENCOUNTER — Inpatient Hospital Stay (HOSPITAL_COMMUNITY): Payer: Medicare Other

## 2021-06-17 DIAGNOSIS — Z96641 Presence of right artificial hip joint: Secondary | ICD-10-CM | POA: Diagnosis not present

## 2021-06-17 DIAGNOSIS — Z7982 Long term (current) use of aspirin: Secondary | ICD-10-CM | POA: Diagnosis not present

## 2021-06-17 DIAGNOSIS — R109 Unspecified abdominal pain: Secondary | ICD-10-CM | POA: Insufficient documentation

## 2021-06-17 DIAGNOSIS — F32A Depression, unspecified: Secondary | ICD-10-CM | POA: Clinically undetermined

## 2021-06-17 DIAGNOSIS — Z91040 Latex allergy status: Secondary | ICD-10-CM | POA: Diagnosis not present

## 2021-06-17 DIAGNOSIS — I10 Essential (primary) hypertension: Secondary | ICD-10-CM | POA: Insufficient documentation

## 2021-06-17 DIAGNOSIS — E039 Hypothyroidism, unspecified: Secondary | ICD-10-CM | POA: Diagnosis not present

## 2021-06-17 DIAGNOSIS — Z20822 Contact with and (suspected) exposure to covid-19: Secondary | ICD-10-CM | POA: Insufficient documentation

## 2021-06-17 DIAGNOSIS — R519 Headache, unspecified: Secondary | ICD-10-CM | POA: Clinically undetermined

## 2021-06-17 DIAGNOSIS — R112 Nausea with vomiting, unspecified: Secondary | ICD-10-CM | POA: Diagnosis present

## 2021-06-17 DIAGNOSIS — Z79899 Other long term (current) drug therapy: Secondary | ICD-10-CM | POA: Diagnosis not present

## 2021-06-17 DIAGNOSIS — K219 Gastro-esophageal reflux disease without esophagitis: Secondary | ICD-10-CM

## 2021-06-17 DIAGNOSIS — Z87891 Personal history of nicotine dependence: Secondary | ICD-10-CM | POA: Insufficient documentation

## 2021-06-17 DIAGNOSIS — K449 Diaphragmatic hernia without obstruction or gangrene: Secondary | ICD-10-CM

## 2021-06-17 DIAGNOSIS — R52 Pain, unspecified: Secondary | ICD-10-CM

## 2021-06-17 LAB — URINALYSIS, ROUTINE W REFLEX MICROSCOPIC
Bacteria, UA: NONE SEEN
Bilirubin Urine: NEGATIVE
Glucose, UA: NEGATIVE mg/dL
Leukocytes,Ua: NEGATIVE
Nitrite: NEGATIVE
Protein, ur: NEGATIVE mg/dL
Specific Gravity, Urine: <1.005 — ABNORMAL LOW (ref 1.005–1.030)
pH: 7 (ref 5.0–8.0)

## 2021-06-17 LAB — CBC WITH DIFFERENTIAL/PLATELET
Abs Immature Granulocytes: 0.04 10*3/uL (ref 0.00–0.07)
Basophils Absolute: 0 10*3/uL (ref 0.0–0.1)
Basophils Relative: 0 %
Eosinophils Absolute: 0 10*3/uL (ref 0.0–0.5)
Eosinophils Relative: 0 %
HCT: 35.2 % — ABNORMAL LOW (ref 36.0–46.0)
Hemoglobin: 12.2 g/dL (ref 12.0–15.0)
Immature Granulocytes: 0 %
Lymphocytes Relative: 6 %
Lymphs Abs: 0.5 10*3/uL — ABNORMAL LOW (ref 0.7–4.0)
MCH: 32.5 pg (ref 26.0–34.0)
MCHC: 34.7 g/dL (ref 30.0–36.0)
MCV: 93.9 fL (ref 80.0–100.0)
Monocytes Absolute: 0.6 10*3/uL (ref 0.1–1.0)
Monocytes Relative: 7 %
Neutro Abs: 7.9 10*3/uL — ABNORMAL HIGH (ref 1.7–7.7)
Neutrophils Relative %: 87 %
Platelets: 400 10*3/uL (ref 150–400)
RBC: 3.75 MIL/uL — ABNORMAL LOW (ref 3.87–5.11)
RDW: 13.3 % (ref 11.5–15.5)
WBC: 9.2 10*3/uL (ref 4.0–10.5)
nRBC: 0 % (ref 0.0–0.2)

## 2021-06-17 LAB — COMPREHENSIVE METABOLIC PANEL
ALT: 21 U/L (ref 0–44)
AST: 26 U/L (ref 15–41)
Albumin: 4.6 g/dL (ref 3.5–5.0)
Alkaline Phosphatase: 57 U/L (ref 38–126)
Anion gap: 14 (ref 5–15)
BUN: 10 mg/dL (ref 8–23)
CO2: 27 mmol/L (ref 22–32)
Calcium: 9.5 mg/dL (ref 8.9–10.3)
Chloride: 88 mmol/L — ABNORMAL LOW (ref 98–111)
Creatinine, Ser: 0.86 mg/dL (ref 0.44–1.00)
GFR, Estimated: >60 mL/min (ref >60)
Glucose, Bld: 117 mg/dL — ABNORMAL HIGH (ref 70–99)
Potassium: 3.5 mmol/L (ref 3.5–5.1)
Sodium: 129 mmol/L — ABNORMAL LOW (ref 135–145)
Total Bilirubin: 1.1 mg/dL (ref 0.3–1.2)
Total Protein: 7.3 g/dL (ref 6.5–8.1)

## 2021-06-17 LAB — LIPASE, BLOOD: Lipase: 33 U/L (ref 11–51)

## 2021-06-17 LAB — RESP PANEL BY RT-PCR (FLU A&B, COVID) ARPGX2
Influenza A by PCR: NEGATIVE
Influenza B by PCR: NEGATIVE
SARS Coronavirus 2 by RT PCR: NEGATIVE

## 2021-06-17 MED ORDER — ONDANSETRON HCL 4 MG/2ML IJ SOLN: 4.0000 mg | Freq: Four times a day (QID) | INTRAMUSCULAR | Status: DC | PRN

## 2021-06-17 MED ORDER — HYDROMORPHONE HCL 1 MG/ML IJ SOLN
0.5000 mg | INTRAMUSCULAR | Status: DC | PRN
  Administered 2021-06-18: 1 mg via INTRAVENOUS
  Filled 2021-06-17: qty 1

## 2021-06-17 MED ORDER — HYDROMORPHONE HCL 1 MG/ML IJ SOLN
1.0000 mg | Freq: Once | INTRAMUSCULAR | Status: AC
  Administered 2021-06-17: 1 mg via INTRAVENOUS
  Filled 2021-06-17: qty 1

## 2021-06-17 MED ORDER — ONDANSETRON HCL 4 MG PO TABS: 4.0000 mg | ORAL_TABLET | Freq: Four times a day (QID) | ORAL | Status: DC | PRN

## 2021-06-17 MED ORDER — OXYCODONE HCL 5 MG PO TABS
5.0000 mg | ORAL_TABLET | Freq: Four times a day (QID) | ORAL | Status: DC | PRN
Start: 2021-06-17 — End: 2021-06-18
  Administered 2021-06-18: 5 mg via ORAL
  Filled 2021-06-17: qty 1

## 2021-06-17 MED ORDER — METOPROLOL TARTRATE 5 MG/5ML IV SOLN
5.0000 mg | Freq: Four times a day (QID) | INTRAVENOUS | Status: DC | PRN
Start: 2021-06-17 — End: 2021-06-18

## 2021-06-17 MED ORDER — DEXTROSE IN LACTATED RINGERS 5 % IV SOLN: INTRAVENOUS | Status: DC

## 2021-06-17 MED ORDER — SODIUM CHLORIDE 0.9 % IV SOLN: INTRAVENOUS | Status: DC

## 2021-06-17 MED ORDER — IOHEXOL 350 MG/ML SOLN
80.0000 mL | Freq: Once | INTRAVENOUS | Status: AC | PRN
  Administered 2021-06-17: 80 mL via INTRAVENOUS

## 2021-06-17 MED ORDER — THIAMINE HCL 100 MG/ML IJ SOLN
Freq: Once | INTRAVENOUS | Status: AC
  Filled 2021-06-17: qty 1000

## 2021-06-17 MED ORDER — HYDROMORPHONE HCL 1 MG/ML IJ SOLN
0.5000 mg | Freq: Once | INTRAMUSCULAR | Status: AC
  Administered 2021-06-17: 0.5 mg via INTRAVENOUS
  Filled 2021-06-17: qty 1

## 2021-06-17 MED ORDER — AMLODIPINE BESYLATE 5 MG PO TABS
5.0000 mg | ORAL_TABLET | Freq: Every day | ORAL | Status: DC
  Administered 2021-06-18: 5 mg via ORAL
  Filled 2021-06-17: qty 1

## 2021-06-17 MED ORDER — ONDANSETRON HCL 4 MG/2ML IJ SOLN
4.0000 mg | Freq: Once | INTRAMUSCULAR | Status: AC
  Administered 2021-06-17: 4 mg via INTRAVENOUS
  Filled 2021-06-17: qty 2

## 2021-06-17 MED ORDER — FAMOTIDINE IN NACL 20-0.9 MG/50ML-% IV SOLN
20.0000 mg | Freq: Once | INTRAVENOUS | Status: AC
  Administered 2021-06-17: 20 mg via INTRAVENOUS
  Filled 2021-06-17: qty 50

## 2021-06-17 MED ORDER — PANTOPRAZOLE SODIUM 40 MG PO TBEC
80.0000 mg | DELAYED_RELEASE_TABLET | Freq: Every day | ORAL | Status: DC
  Administered 2021-06-18: 80 mg via ORAL
  Filled 2021-06-17: qty 2

## 2021-06-17 MED ORDER — OXYCODONE-ACETAMINOPHEN 5-325 MG PO TABS
1.0000 | ORAL_TABLET | Freq: Four times a day (QID) | ORAL | Status: DC | PRN
  Administered 2021-06-17 – 2021-06-18 (×2): 1 via ORAL
  Filled 2021-06-17 (×2): qty 1

## 2021-06-17 MED ORDER — POLYETHYLENE GLYCOL 3350 17 G PO PACK: 17.0000 g | PACK | Freq: Every day | ORAL | Status: DC | PRN

## 2021-06-17 MED ORDER — DULOXETINE HCL 30 MG PO CPEP
60.0000 mg | ORAL_CAPSULE | Freq: Every day | ORAL | Status: DC
  Administered 2021-06-18: 60 mg via ORAL
  Filled 2021-06-17: qty 2

## 2021-06-17 MED ORDER — ZOLPIDEM TARTRATE 5 MG PO TABS
10.0000 mg | ORAL_TABLET | Freq: Every evening | ORAL | Status: DC | PRN
  Administered 2021-06-17: 10 mg via ORAL
  Filled 2021-06-17: qty 1

## 2021-06-17 MED ORDER — SIMVASTATIN 20 MG PO TABS
10.0000 mg | ORAL_TABLET | Freq: Every evening | ORAL | Status: DC
  Administered 2021-06-17: 10 mg via ORAL
  Filled 2021-06-17: qty 1

## 2021-06-17 MED ORDER — ENOXAPARIN SODIUM 40 MG/0.4ML IJ SOSY: 40.0000 mg | PREFILLED_SYRINGE | INTRAMUSCULAR | Status: DC

## 2021-06-17 MED ORDER — OXYCODONE-ACETAMINOPHEN 10-325 MG PO TABS: 1.0000 | ORAL_TABLET | Freq: Four times a day (QID) | ORAL | Status: DC | PRN

## 2021-06-17 MED ORDER — SODIUM CHLORIDE 0.9 % IV BOLUS
1000.0000 mL | Freq: Once | INTRAVENOUS | Status: AC
  Administered 2021-06-17: 1000 mL via INTRAVENOUS

## 2021-06-17 NOTE — ED Notes (Addendum)
PT declined to be transported to MRI, stating that she would like the test done tomorrow. MRI team and MD notified.

## 2021-06-17 NOTE — H&P (Signed)
History and Physical    Kaitlin Little FYB:[id number] DOB: 08/05/1946 DOA: 06/17/2021  PCP: Salli Real, MD  Patient coming from: Home  I have personally briefly reviewed patient's old medical records in [facility]  Chief Complaint: Nausea and vomiting  HPI: Kaitlin Little is a 75 y.o. female with medical history significant of GERD, anemia, hiatal hernia, arthritis, depression, hypothyroid, headache who presents with intractable nausea and vomiting.  She reports this has been going on for approximately the last 3 days.  She reports this started when she began a sure prep for planned colonoscopy.  She has had ongoing diarrhea at the same time.  She has epigastric discomfort as well as belching.  She denies blood in her stool. (ED Course: In the ED she underwent CT scanning that showed intra and extrahepatic biliary duct dilation.  The scan also showed possible gastric wall thickening.  The EDP called GI who suggested the patient have an MRCP.  TR[facility] is asked to admit for further work-up.  Review of Systems: As per HPI otherwise 10 point review of systems negative.   Past Medical History:  Diagnosis Date   Anemia    bleeding ulcers no blood transfusions   Arthritis    Depression    GERD (gastroesophageal reflux disease)    Headache    sinus headaches   History of hiatal hernia    Hypertension    Hyperthyroidism    benign nodules in neck no meds    Past Surgical History:  Procedure Laterality Date   ABDOMINAL HYSTERECTOMY     partial   BACK SURGERY     lower   BREAST SURGERY     lumpectomy bil breast   FOOT SURGERY     bil bunions and straightening great toe   JOINT REPLACEMENT     Left hip right hip 04/27/17 Dr. Salena Saner blackman   knee arthroscopy     left   TOTAL HIP ARTHROPLASTY Right 04/27/2017   Procedure: RIGHT TOTAL HIP ARTHROPLASTY ANTERIOR APPROACH;  Surgeon: Kathryne Hitch, MD;  Location: [facility];  Service: Orthopedics;  Laterality: Right;     reports that she has  quit smoking. Her smoking use included cigarettes. She smoked an average of .5 packs per day. She has never used smokeless tobacco. She reports current alcohol use of about 1.0 standard drink per week. She reports that she does not use drugs.  Allergies  Allergen Reactions   Adhesive [Tape] Other (See Comments)    Skin tears/redness. Cloth/paper tape tolerates   Ampicillin Hives and Diarrhea    TOLERATES ORALLY NOT INTRAVENOUS  Has patient had a PCN reaction causing immediate rash, facial/tongue/throat swelling, SOB or lightheadedness with hypotension:No Has patient had a PCN reaction causing severe rash involving mucus membranes or skin necrosis:Unknown Has patient had a PCN reaction that required hospitalization:Inpatient when reaction occurred. Has patient had a PCN reaction occurring within the last 10 years: No If all of the above answers are "NO", then may proceed with Cephalosporin   Latex Other (See Comments)    Skin redness/peels skin    History reviewed. No pertinent family history. Patient lives at home with her husband  Prior to Admission medications   Medication Sig Start Date End Date Taking? Authorizing Provider  amLODipine (NORVASC) 5 MG tablet Take 5 mg by mouth daily. 11/05/15   [provider]  aspirin 81 MG chewable tablet Chew 1 tablet (81 mg total) by mouth 2 (two) times daily. Patient not taking: Reported  on 06/12/2017 04/30/17   Kathryne Hitch, MD  aspirin-acetaminophen-caffeine (EXCEDRIN MIGRAINE) [id number] MG tablet Take 1 tablet by mouth every 6 (six) hours as needed for headache.    [provider]  Biotin 10 MG CAPS Take 10 mg by mouth 2 (two) times a week.     [provider]  Cholecalciferol (VITAMIN D) 2000 units CAPS Take 2,000 Units by mouth daily.    [provider]  Coenzyme Q10 200 MG capsule Take 200 mg by mouth daily.    [provider]  doxycycline (VIBRAMYCIN) 100 MG capsule Take 1 capsule (100  mg total) by mouth 2 (two) times daily. Patient not taking: Reported on 06/21/2019 12/08/17   Lorre Nick, MD  DULoxetine (CYMBALTA) 60 MG capsule Take 60 mg by mouth daily. 01/05/16   [provider]  esomeprazole (NEXIUM) 40 MG capsule Take 40 mg by mouth daily before breakfast. 02/10/17   [provider]  HYDROmorphone (DILAUDID) 2 MG tablet Take 1 tablet (2 mg total) by mouth every 4 (four) hours as needed for severe pain. Patient not taking: Reported on 06/11/2017 05/03/17   Kathryne Hitch, MD  Menthol (COOL N HEAT/BACK) 5 % [facility] 1 patch onto the skin daily as needed (for pain.).    [provider]  methocarbamol (ROBAXIN) 500 MG tablet Take 1 tablet (500 mg total) by mouth every 6 (six) hours as needed for muscle spasms. Patient not taking: Reported on 06/12/2017 04/30/17   Kathryne Hitch, MD  MOVANTIK 25 MG TABS tablet Take 25 mg by mouth daily. 01/17/16   [provider]  Multiple Vitamin (MULTIVITAMIN WITH MINERALS) TABS tablet Take 1 tablet by mouth daily.    [provider]  oxyCODONE-acetaminophen (PERCOCET) 10-325 MG tablet Take 1 tablet by mouth every 6 (six) hours as needed for pain. Patient not taking: Reported on 07/03/2019 04/30/17   Kathryne Hitch, MD  simvastatin (ZOCOR) 10 MG tablet Take 10 mg by mouth every evening.  12/11/15   [provider]  zolpidem (AMBIEN) 10 MG tablet Take 10 mg by mouth at bedtime as needed for sleep.    [provider]    Physical Exam: Vitals:   06/17/21 1830 06/17/21 1900 06/17/21 1930 06/17/21 2000  BP:  (!) 147/71 (!) 136/58 (!) 145/56  Pulse: 74 75 77 78  Resp:   18 18  Temp:      TempSrc:      SpO2: 100% 100% 97% 99%  Weight:      Height:        Constitutional: NAD, calm, comfortable Vitals:   06/17/21 1830 06/17/21 1900 06/17/21 1930 06/17/21 2000  BP:  (!) 147/71 (!) 136/58 (!) 145/56  Pulse: 74 75 77 78  Resp:   18 18  Temp:      TempSrc:       SpO2: 100% 100% 97% 99%  Weight:      Height:       Eyes: PERRL, lids and conjunctivae normal ENMT: Mucous membranes are moist. Posterior pharynx clear of any exudate or lesions.Normal dentition.  Neck: normal, supple, no masses, no thyromegaly Respiratory: clear to auscultation bilaterally, no wheezing, no crackles. Normal respiratory effort. No accessory muscle use.  Cardiovascular: Regular rate and rhythm, no murmurs / rubs / gallops. No extremity edema. 2+ pedal pulses. No carotid bruits.  Abdomen: no tenderness, no masses palpated. No hepatosplenomegaly. Bowel sounds positive.  Musculoskeletal: no clubbing / cyanosis. No joint deformity upper and  lower extremities. Good ROM, no contractures. Normal muscle tone.  Skin: no rashes, lesions, ulcers. No induration Neurologic: CN 2-12 grossly intact. Sensation intact, DTR normal. Strength 5/5 in all 4.  Psychiatric: Normal judgment and insight. Alert and oriented x 3. Normal mood.   Labs on Admission: I have personally reviewed following labs and imaging studies  CBC: Recent Labs  Lab 06/17/21 1408  WBC 9.2  NEUTROABS 7.9*  HGB 12.2  HCT 35.2*  MCV 93.9  PLT 400   Basic Metabolic Panel: Recent Labs  Lab 06/17/21 1408  NA 129*  K 3.5  CL 88*  CO2 27  GLUCOSE 117*  BUN 10  CREATININE 0.86  CALCIUM 9.5   GFR: Estimated Creatinine Clearance: 47.5 mL/min (by C-G formula based on SCr of 0.86 mg/dL). Liver Function Tests: Recent Labs  Lab 06/17/21 1408  AST 26  ALT 21  ALKPHOS 57  BILITOT 1.1  PROT 7.3  ALBUMIN 4.6   Recent Labs  Lab 06/17/21 1408  LIPASE 33   No results for input(s): AMMONIA in the last 168 hours. Coagulation Profile: No results for input(s): INR, PROTIME in the last 168 hours. Cardiac Enzymes: No results for input(s): CKTOTAL, CKMB, CKMBINDEX, TROPONINI in the last 168 hours. BNP (last 3 results) No results for input(s): PROBNP in the last 8760 hours. HbA1C: No results for input(s):  HGBA1C in the last 72 hours. CBG: No results for input(s): GLUCAP in the last 168 hours. Lipid Profile: No results for input(s): CHOL, HDL, LDLCALC, TRIG, CHOLHDL, LDLDIRECT in the last 72 hours. Thyroid Function Tests: No results for input(s): TSH, T4TOTAL, FREET4, T3FREE, THYROIDAB in the last 72 hours. Anemia Panel: No results for input(s): VITAMINB12, FOLATE, FERRITIN, TIBC, IRON, RETICCTPCT in the last 72 hours. Urine analysis:    Component Value Date/Time   COLORURINE YELLOW (A) 06/17/2021 1829   APPEARANCEUR CLEAR (A) 06/17/2021 1829   LABSPEC <1.005 (L) 06/17/2021 1829   PHURINE 7.0 06/17/2021 1829   GLUCOSEU NEGATIVE 06/17/2021 1829   HGBUR TRACE (A) 06/17/2021 1829   BILIRUBINUR NEGATIVE 06/17/2021 1829   KETONESUR TRACE (A) 06/17/2021 1829   PROTEINUR NEGATIVE 06/17/2021 1829   NITRITE NEGATIVE 06/17/2021 1829   LEUKOCYTESUR NEGATIVE 06/17/2021 1829    Radiological Exams on Admission: CT Abdomen Pelvis W Contrast  Result Date: 06/17/2021 CLINICAL DATA:  Acute abdominal pain. EXAM: CT ABDOMEN AND PELVIS WITH CONTRAST TECHNIQUE: Multidetector CT imaging of the abdomen and pelvis was performed using the standard protocol following bolus administration of intravenous contrast. CONTRAST:  9mL OMNIPAQUE IOHEXOL 350 MG/ML SOLN COMPARISON:  None. FINDINGS: Lower chest: No acute abnormality. Hepatobiliary: There is intra and extrahepatic biliary ductal dilatation. Common bile duct measures 1 cm. No calcified gallstones are seen. There is no pericholecystic inflammation. There is a small enhancing lesion in the dome of the liver measuring 6 mm. No other liver lesions are identified. Pancreas: Unremarkable. No pancreatic ductal dilatation or surrounding inflammatory changes. Spleen: Normal in size without focal abnormality. Adrenals/Urinary Tract: There is some cortical scarring in the left kidney. The kidneys otherwise appear within normal limits. The bladder is not well evaluated  secondary to streak artifact in the pelvis. Stomach/Bowel: Stomach is decompressed in gastric wall thickening cannot be excluded limits. Appendix appears normal. No evidence of bowel wall thickening, distention, or inflammatory changes. Vascular/Lymphatic: Aortic atherosclerosis. No enlarged abdominal or pelvic lymph nodes. Reproductive: Structures in the pelvis are not well evaluated secondary to streak artifact from the hips. Other: There is no ascites or  free air. No focal abdominal wall hernia. Musculoskeletal: There are degenerative changes at L5-S1. bilateral hip arthroplasties are present. IMPRESSION: 1. Intra and extrahepatic biliary ductal dilatation. No calcified gallstones are present. Findings are concerning for distal biliary obstruction. Recommend correlation with lab values. This can be further evaluated with ultrasound, MRCP or ERCP. 2. Gastric wall thickening versus normal under distension. Correlate for gastritis. 3.  Aortic Atherosclerosis (ICD10-I70.0). Electronically Signed   By: Darliss Cheney M.D.   On: 06/17/2021 18:10   DG Abd Acute W/Chest  Result Date: 06/17/2021 CLINICAL DATA:  Abdominal pain for 2 months.  Vomiting. EXAM: DG ABDOMEN ACUTE WITH 1 VIEW CHEST COMPARISON:  None. FINDINGS: There is no evidence of dilated bowel loops or free intraperitoneal air. No radiopaque calculi or other significant radiographic abnormality is seen. Heart size and mediastinal contours are within normal limits. Both lungs are clear. IMPRESSION: No abnormal bowel dilatation.  No acute cardiopulmonary disease. Electronically Signed   By: Lupita Raider M.D.   On: 06/17/2021 14:48    Assessment/Plan Principal Problem:   Intractable nausea and vomiting Active Problems:   GERD (gastroesophageal reflux disease)   Hiatal hernia   Depression   Headache  Intractable nausea and vomiting This has been ongoing and is intractable. LFTs are normal however CT suggest intra and extrahepatic biliary duct  dilation. IV fluid hydration Antiemetics MRCP in the morning  Per GI if if something that can be done they will consult versus general surgery depending on findings of the MRCP.  GERD/hiatal hernia Continue Nexium  Hypertension Continue lisinopril and amlodipine  Depression Continue Cymbalta  Headache As needed meds given  Hyperthyroid Check TSH  Hyperlipidemia Continue Zocor  Arthritis with chronic neck and back pain. Patient reports she is on opiates 4 times per day We will continue these during her stay   DVT prophylaxis: Lovenox SQ Code Status: Full code confirmed with patient Family Communication: Patient at bedside Disposition Plan: Home Consults called: GI called by EDP follow-up tomorrow following MRCP, consider surgery if needed Admission status: Inpatient patient admitted to inpatient due to intractable nausea vomiting need for IV fluid and electrolyte repletion and continued work-up.   Reva Bores MD [facility]  If 7PM-7AM, please contact night-coverage 06/17/2021, 8:51 PM

## 2021-06-17 NOTE — ED Triage Notes (Signed)
Pt arrived via EMS, from home, vomiting since Wednesday. Colonoscopy prep wens. Was unable to have procedure due to vomiting    Given 22G R FA  500NS

## 2021-06-17 NOTE — ED Provider Notes (Signed)
Emergency Medicine Provider Triage Evaluation Note  Kaitlin Little , a 75 y.o. female  was evaluated in triage.  Pt complains of abdominal pain vomiting and diarrhea.  Patient states symptoms started 3 days ago.  She was attempting a bowel prep for colonoscopy which she was unable to do.  Review of Systems  Positive: Vomiting, abdominal pain, back pain Negative: No fever  Physical Exam  BP 128/70 (BP Location: Left Arm)   Pulse 83   Temp 99 F (37.2 C) (Oral)   Resp 19   Ht 1.6 m (5\' 3" )   Wt 56.7 kg   SpO2 99%   BMI 22.14 kg/m  Gen:   Awake, no distress   Resp:  Normal effort  MSK:   Moves extremities without difficulty  Other:  Abdomen, tenderness palpation  Medical Decision Making  Medically screening exam initiated at 1:50 PM.  Appropriate orders placed.  Kaitlin Little was informed that the remainder of the evaluation will be completed by another provider, this initial triage assessment does not replace that evaluation, and the importance of remaining in the ED until their evaluation is complete.  Labs, IV fluids x-rays pain meds antiemetics ordered   Ether Griffins, MD 06/17/21 1351

## 2021-06-17 NOTE — ED Provider Notes (Signed)
[facility]-EMERGENCY DEPT Provider Note   CSN: [id number] Arrival date & time: 06/17/21  1318     History Chief Complaint  Patient presents with   Emesis    Kaitlin Little is a 75 y.o. female.  HPI  75 year old female past medical history of hiatal hernia, gastric ulcers, GERD, HTN presents the emergency department with epigastric abdominal discomfort and nausea/vomiting/diarrhea.  Patient states symptoms started 3 days ago.  She has had the epigastric discomfort with GERD chronically when she was diagnosed with a hiatal hernia and gastric ulcers.  She was planned for colonoscopy and started to the GI prep about 3 days ago when her symptoms worsened.  She denies any blood in the stool or emesis.  Denies any fever.  Her abdominal pain is improving but still persistent, crampy in the mid to upper abdomen.  Past Medical History:  Diagnosis Date   Anemia    bleeding ulcers no blood transfusions   Arthritis    Depression    GERD (gastroesophageal reflux disease)    Headache    sinus headaches   History of hiatal hernia    Hypertension    Hyperthyroidism    benign nodules in neck no meds    Patient Active Problem List   Diagnosis Date Noted   Status post total replacement of right hip 04/27/2017   Pain in right hip 01/08/2017   Unilateral primary osteoarthritis, right hip 01/08/2017    Past Surgical History:  Procedure Laterality Date   ABDOMINAL HYSTERECTOMY     partial   BACK SURGERY     lower   BREAST SURGERY     lumpectomy bil breast   FOOT SURGERY     bil bunions and straightening great toe   JOINT REPLACEMENT     Left hip right hip 04/27/17 Dr. Salena Saner blackman   knee arthroscopy     left   TOTAL HIP ARTHROPLASTY Right 04/27/2017   Procedure: RIGHT TOTAL HIP ARTHROPLASTY ANTERIOR APPROACH;  Surgeon: Kathryne Hitch, MD;  Location: [facility];  Service: Orthopedics;  Laterality: Right;     OB History   No obstetric history on file.      History reviewed. No pertinent family history.  Social History   Tobacco Use   Smoking status: Former    Packs/day: 0.50    Types: Cigarettes   Smokeless tobacco: Never   Tobacco comments:    40 years  Vaping Use   Vaping Use: Every day  Substance Use Topics   Alcohol use: Yes    Alcohol/week: 1.0 standard drink    Types: 1 Glasses of wine per week    Comment: 4x /month   Drug use: No    Home Medications Prior to Admission medications   Medication Sig Start Date End Date Taking? Authorizing Provider  amLODipine (NORVASC) 5 MG tablet Take 5 mg by mouth daily. 11/05/15   [provider]  aspirin 81 MG chewable tablet Chew 1 tablet (81 mg total) by mouth 2 (two) times daily. Patient not taking: Reported on 06/12/2017 04/30/17   Kathryne Hitch, MD  aspirin-acetaminophen-caffeine [facility] MIGRAINE) [phone] MG tablet Take 1 tablet by mouth every 6 (six) hours as needed for headache.    [provider]  Biotin 10 MG CAPS Take 10 mg by mouth 2 (two) times a week.     [provider]  Cholecalciferol (VITAMIN D) 2000 units CAPS Take 2,000 Units by mouth daily.    [provider]  Coenzyme Q10 200 MG capsule Take 200 mg by mouth daily.    [provider]  doxycycline (VIBRAMYCIN) 100 MG capsule Take 1 capsule (100 mg total) by mouth 2 (two) times daily. Patient not taking: Reported on 06/21/2019 12/08/17   Lorre Nick, MD  DULoxetine (CYMBALTA) 60 MG capsule Take 60 mg by mouth daily. 01/05/16   [provider]  esomeprazole (NEXIUM) 40 MG capsule Take 40 mg by mouth daily before breakfast. 02/10/17   [provider]  HYDROmorphone (DILAUDID) 2 MG tablet Take 1 tablet (2 mg total) by mouth every 4 (four) hours as needed for severe pain. Patient not taking: Reported on 06/11/2017 05/03/17   Kathryne Hitch, MD  Menthol (COOL N HEAT/BACK) 5 % [facility] 1 patch onto the skin daily as needed (for pain.).     [provider]  methocarbamol (ROBAXIN) 500 MG tablet Take 1 tablet (500 mg total) by mouth every 6 (six) hours as needed for muscle spasms. Patient not taking: Reported on 06/12/2017 04/30/17   Kathryne Hitch, MD  MOVANTIK 25 MG TABS tablet Take 25 mg by mouth daily. 01/17/16   [provider]  Multiple Vitamin (MULTIVITAMIN WITH MINERALS) TABS tablet Take 1 tablet by mouth daily.    [provider]  oxyCODONE-acetaminophen (PERCOCET) 10-325 MG tablet Take 1 tablet by mouth every 6 (six) hours as needed for pain. Patient not taking: Reported on 07/03/2019 04/30/17   Kathryne Hitch, MD  simvastatin (ZOCOR) 10 MG tablet Take 10 mg by mouth every evening.  12/11/15   [provider]  zolpidem (AMBIEN) 10 MG tablet Take 10 mg by mouth at bedtime as needed for sleep.    [provider]    Allergies    Adhesive [tape], Ampicillin, and Latex  Review of Systems   Review of Systems  Constitutional:  Positive for appetite change and fatigue. Negative for chills and fever.  HENT:  Negative for congestion.   Eyes:  Negative for visual disturbance.  Respiratory:  Negative for shortness of breath.   Cardiovascular:  Negative for chest pain.  Gastrointestinal:  Positive for abdominal pain, diarrhea, nausea and vomiting. Negative for blood in stool.  Genitourinary:  Negative for dysuria.  Skin:  Negative for rash.  Neurological:  Negative for headaches.   Physical Exam Updated Vital Signs BP (!) 145/64   Pulse 63   Temp 99 F (37.2 C) (Oral)   Resp 18   Ht 5\' 3"  (1.6 m)   Wt 56.7 kg   SpO2 100%   BMI 22.14 kg/m   Physical Exam Vitals and nursing note reviewed.  Constitutional:      General: She is not in acute distress.    Appearance: Normal appearance.  HENT:     Head: Normocephalic.     Mouth/Throat:     Mouth: Mucous membranes are moist.  Cardiovascular:     Rate and Rhythm: Normal rate.  Pulmonary:     Effort: Pulmonary  effort is normal. No respiratory distress.  Abdominal:     General: Bowel sounds are normal.     Palpations: Abdomen is soft.     Tenderness: There is abdominal tenderness. There is no guarding.  Skin:    General: Skin is warm.  Neurological:     Mental Status: She is alert and oriented to person, place, and time. Mental status is at baseline.  Psychiatric:        Mood and Affect: Mood normal.  ED Results / Procedures / Treatments   Labs (all labs ordered are listed, but only abnormal results are displayed) Labs Reviewed  COMPREHENSIVE METABOLIC PANEL - Abnormal; Notable for the following components:      Result Value   Sodium 129 (*)    Chloride 88 (*)    Glucose, Bld 117 (*)    All other components within normal limits  CBC WITH DIFFERENTIAL/PLATELET - Abnormal; Notable for the following components:   RBC 3.75 (*)    HCT 35.2 (*)    Neutro Abs 7.9 (*)    Lymphs Abs 0.5 (*)    All other components within normal limits  LIPASE, BLOOD  URINALYSIS, ROUTINE W REFLEX MICROSCOPIC    EKG None  Radiology DG Abd Acute W/Chest  Result Date: 06/17/2021 CLINICAL DATA:  Abdominal pain for 2 months.  Vomiting. EXAM: DG ABDOMEN ACUTE WITH 1 VIEW CHEST COMPARISON:  None. FINDINGS: There is no evidence of dilated bowel loops or free intraperitoneal air. No radiopaque calculi or other significant radiographic abnormality is seen. Heart size and mediastinal contours are within normal limits. Both lungs are clear. IMPRESSION: No abnormal bowel dilatation.  No acute cardiopulmonary disease. Electronically Signed   By: Lupita Raider M.D.   On: 06/17/2021 14:48    Procedures Procedures   Medications Ordered in ED Medications  sodium chloride 0.9 % bolus 1,000 mL (1,000 mLs Intravenous New Bag/Given 06/17/21 1655)    And  0.9 %  sodium chloride infusion ( Intravenous New Bag/Given 06/17/21 1659)  HYDROmorphone (DILAUDID) injection 0.5 mg (0.5 mg Intravenous Given 06/17/21 1659)   ondansetron (ZOFRAN) injection 4 mg (4 mg Intravenous Given 06/17/21 1656)  iohexol (OMNIPAQUE) 350 MG/ML injection 80 mL (80 mLs Intravenous Contrast Given 06/17/21 1727)    ED Course  I have reviewed the triage vital signs and the nursing notes.  Pertinent labs & imaging results that were available during my care of the patient were reviewed by me and considered in my medical decision making (see chart for details).    MDM Rules/Calculators/A&P                           76 year old female presents the emergency department concern for nausea/vomiting/diarrhea. Vomiting in the department, generally uncomfortable abdomen on exam. No peritonitis.  Lab work appears baseline but CT scan shows intra and extra hepatic biliary duct dilation, MRCP recommended. Patient continues to require medication for n/v. GI consulted who recommends medical admit and further imaging. Patient admitted for further care, stable at time of admission.  Final Clinical Impression(s) / ED Diagnoses Final diagnoses:  None    Rx / DC Orders ED Discharge Orders     None        Rozelle Logan, DO 06/19/21 1558

## 2021-06-18 ENCOUNTER — Inpatient Hospital Stay (HOSPITAL_COMMUNITY): Payer: Medicare Other

## 2021-06-18 DIAGNOSIS — R112 Nausea with vomiting, unspecified: Secondary | ICD-10-CM | POA: Diagnosis not present

## 2021-06-18 LAB — COMPREHENSIVE METABOLIC PANEL
ALT: 18 U/L (ref 0–44)
AST: 21 U/L (ref 15–41)
Albumin: 4 g/dL (ref 3.5–5.0)
Alkaline Phosphatase: 47 U/L (ref 38–126)
Anion gap: 10 (ref 5–15)
BUN: 6 mg/dL — ABNORMAL LOW (ref 8–23)
CO2: 27 mmol/L (ref 22–32)
Calcium: 8.8 mg/dL — ABNORMAL LOW (ref 8.9–10.3)
Chloride: 99 mmol/L (ref 98–111)
Creatinine, Ser: 0.59 mg/dL (ref 0.44–1.00)
GFR, Estimated: >60 mL/min (ref >60)
Glucose, Bld: 91 mg/dL (ref 70–99)
Potassium: 3 mmol/L — ABNORMAL LOW (ref 3.5–5.1)
Sodium: 136 mmol/L (ref 135–145)
Total Bilirubin: 0.9 mg/dL (ref 0.3–1.2)
Total Protein: 6.2 g/dL — ABNORMAL LOW (ref 6.5–8.1)

## 2021-06-18 LAB — CBC
HCT: 31.7 % — ABNORMAL LOW (ref 36.0–46.0)
Hemoglobin: 11 g/dL — ABNORMAL LOW (ref 12.0–15.0)
MCH: 32.6 pg (ref 26.0–34.0)
MCHC: 34.7 g/dL (ref 30.0–36.0)
MCV: 94.1 fL (ref 80.0–100.0)
Platelets: 351 10*3/uL (ref 150–400)
RBC: 3.37 MIL/uL — ABNORMAL LOW (ref 3.87–5.11)
RDW: 13.6 % (ref 11.5–15.5)
WBC: 9.1 10*3/uL (ref 4.0–10.5)
nRBC: 0 % (ref 0.0–0.2)

## 2021-06-18 LAB — MAGNESIUM: Magnesium: 1.7 mg/dL (ref 1.7–2.4)

## 2021-06-18 MED ORDER — ONDANSETRON HCL 4 MG PO TABS: 4.0000 mg | ORAL_TABLET | Freq: Four times a day (QID) | ORAL | 0 refills | Status: AC | PRN

## 2021-06-18 MED ORDER — LORAZEPAM 1 MG PO TABS
1.0000 mg | ORAL_TABLET | Freq: Once | ORAL | Status: AC
  Administered 2021-06-18: 1 mg via ORAL
  Filled 2021-06-18: qty 1

## 2021-06-18 MED ORDER — DULOXETINE HCL 30 MG PO CPEP: 30.0000 mg | ORAL_CAPSULE | Freq: Every day | ORAL | Status: DC

## 2021-06-18 MED ORDER — POTASSIUM CHLORIDE CRYS ER 20 MEQ PO TBCR
60.0000 meq | EXTENDED_RELEASE_TABLET | Freq: Once | ORAL | Status: AC
  Administered 2021-06-18: 60 meq via ORAL
  Filled 2021-06-18: qty 3

## 2021-06-18 MED ORDER — GADOBUTROL 1 MMOL/ML IV SOLN
5.5000 mL | Freq: Once | INTRAVENOUS | Status: AC | PRN
  Administered 2021-06-18: 5.5 mL via INTRAVENOUS

## 2021-06-18 NOTE — Care Management CC44 (Signed)
Condition Code 44 Documentation Completed  Patient Details  Name: Kaitlin Little MRN: [id number] Date of Birth: 07-27-1946   Condition Code 44 given:  Yes Patient signature on Condition Code 44 notice:  Yes Documentation of 2 MD's agreement:  Yes Code 44 added to claim:  Yes    Armanda Heritage, RN 06/18/2021, 1:19 PM

## 2021-06-18 NOTE — Consult Note (Signed)
Referring Provider:  EDP Primary Care Physician:  Salli Real, MD Primary Gastroenterologist: [facility]  Reason for Consultation: Nausea and vomiting.  Abnormal CT concerning for biliary dilation  HPI: Kaitlin Little is a 75 y.o. female with past medical history of arthritis, depression, anemia and GERD presented to the hospital with intractable nausea and vomiting a few days duration.  Patient started having nausea and vomiting after taking Suprep for colonoscopy on Wednesday.  Upon initial evaluation in the emergency department, she was found to have normal CBC, CMP and lipase.  CT abdomen pelvis with contrast showed intra and extrahepatic biliary ductal dilation with CBD measuring around 1 cm.  No gallstones.  Recommended ERCP or MRCP for further evaluation.  CT scan also showed decompressed stomach with possible gastric wall thickening cannot be excluded.  Patient seen and examined at bedside.  She is feeling much better now.  No further vomiting.  Nausea controlled with as needed medicine.  She denies any further abdominal pain.  Denies diarrhea or constipation.  She is complaining of more back pain and she wants to go home.  Past Medical History:  Diagnosis Date   Anemia    bleeding ulcers no blood transfusions   Arthritis    Depression    GERD (gastroesophageal reflux disease)    Headache    sinus headaches   History of hiatal hernia    Hypertension    Hyperthyroidism    benign nodules in neck no meds    Past Surgical History:  Procedure Laterality Date   ABDOMINAL HYSTERECTOMY     partial   BACK SURGERY     lower   BREAST SURGERY     lumpectomy bil breast   FOOT SURGERY     bil bunions and straightening great toe   JOINT REPLACEMENT     Left hip right hip 04/27/17 Dr. Salena Saner blackman   knee arthroscopy     left   TOTAL HIP ARTHROPLASTY Right 04/27/2017   Procedure: RIGHT TOTAL HIP ARTHROPLASTY ANTERIOR APPROACH;  Surgeon: Kathryne Hitch, MD;  Location: [facility];   Service: Orthopedics;  Laterality: Right;    Prior to Admission medications   Medication Sig Start Date End Date Taking? Authorizing Provider  amLODipine (NORVASC) 5 MG tablet Take 5 mg by mouth daily. 11/05/15   [provider]  aspirin 81 MG chewable tablet Chew 1 tablet (81 mg total) by mouth 2 (two) times daily. Patient not taking: Reported on 06/12/2017 04/30/17   Kathryne Hitch, MD  aspirin-acetaminophen-caffeine [facility] MIGRAINE) [phone] MG tablet Take 1 tablet by mouth every 6 (six) hours as needed for headache.    [provider]  Biotin 10 MG CAPS Take 10 mg by mouth 2 (two) times a week.     [provider]  Cholecalciferol (VITAMIN D) 2000 units CAPS Take 2,000 Units by mouth daily.    [provider]  Coenzyme Q10 200 MG capsule Take 200 mg by mouth daily.    [provider]  DULoxetine (CYMBALTA) 60 MG capsule Take 60 mg by mouth daily. 01/05/16   [provider]  esomeprazole (NEXIUM) 40 MG capsule Take 40 mg by mouth daily before breakfast. 02/10/17   [provider]  HYDROmorphone (DILAUDID) 2 MG tablet Take 1 tablet (2 mg total) by mouth every 4 (four) hours as needed for severe pain. Patient not taking: Reported on 06/11/2017 05/03/17   Kathryne Hitch, MD  Menthol (COOL N HEAT/BACK) 5 % [facility]  1 patch onto the skin daily as needed (for pain.).    [provider]  methocarbamol (ROBAXIN) 500 MG tablet Take 1 tablet (500 mg total) by mouth every 6 (six) hours as needed for muscle spasms. Patient not taking: Reported on 06/12/2017 04/30/17   Kathryne Hitch, MD  MOVANTIK 25 MG TABS tablet Take 25 mg by mouth daily. 01/17/16   [provider]  Multiple Vitamin (MULTIVITAMIN WITH MINERALS) TABS tablet Take 1 tablet by mouth daily.    [provider]  oxyCODONE-acetaminophen (PERCOCET) 10-325 MG tablet Take 1 tablet by mouth every 6 (six) hours as needed for pain. Patient  not taking: Reported on 07/03/2019 04/30/17   Kathryne Hitch, MD  simvastatin (ZOCOR) 10 MG tablet Take 10 mg by mouth every evening.  12/11/15   [provider]  zolpidem (AMBIEN) 10 MG tablet Take 10 mg by mouth at bedtime as needed for sleep.    [provider]    Scheduled Meds:  amLODipine  5 mg Oral Daily   DULoxetine  60 mg Oral Daily   enoxaparin (LOVENOX) injection  40 mg Subcutaneous Q24H   pantoprazole  80 mg Oral Q1200   simvastatin  10 mg Oral QPM   Continuous Infusions:  sodium chloride 125 mL/hr at 06/18/21 0004   dextrose 5% lactated ringers     PRN Meds:.HYDROmorphone (DILAUDID) injection, metoprolol tartrate, ondansetron **OR** ondansetron (ZOFRAN) IV, oxyCODONE-acetaminophen **AND** oxyCODONE, polyethylene glycol, zolpidem  Allergies as of 06/17/2021 - Review Complete 06/17/2021  Allergen Reaction Noted   Adhesive [tape] Other (See Comments) 04/19/2017   Ampicillin Hives and Diarrhea 01/25/2016   Latex Other (See Comments) 04/19/2017    History reviewed. No pertinent family history.  Social History   Socioeconomic History   Marital status: Married    Spouse name: Not on file   Number of children: Not on file   Years of education: Not on file   Highest education level: Not on file  Occupational History   Not on file  Tobacco Use   Smoking status: Former    Packs/day: 0.50    Types: Cigarettes   Smokeless tobacco: Never   Tobacco comments:    40 years  Vaping Use   Vaping Use: Every day  Substance and Sexual Activity   Alcohol use: Yes    Alcohol/week: 1.0 standard drink    Types: 1 Glasses of wine per week    Comment: 4x /month   Drug use: No   Sexual activity: Yes  Other Topics Concern   Not on file  Social History Narrative   Not on file   Social Determinants of Health   Financial Resource Strain: Not on file  Food Insecurity: Not on file  Transportation Needs: Not on file  Physical Activity: Not on file   Stress: Not on file  Social Connections: Not on file  Intimate Partner Violence: Not on file    Review of Systems: 12 point review of system is done which is negative except as mentioned in HPI  Physical Exam: Vital signs: Vitals:   06/18/21 0222 06/18/21 0626  BP: (!) 148/54 (!) 155/82  Pulse: 76 84  Resp: 18 18  Temp: 98.4 F (36.9 C) 99.1 F (37.3 C)  SpO2: 100% 98%   Last BM Date: 06/16/21  Physical Exam Constitutional:      Appearance: Normal appearance.  HENT:     Head: Normocephalic and atraumatic.     Nose: Nose normal.  Mouth/Throat:     Mouth: Mucous membranes are dry.  Eyes:     Extraocular Movements: Extraocular movements intact.  Cardiovascular:     Rate and Rhythm: Normal rate and regular rhythm.     Heart sounds: No murmur heard. Pulmonary:     Effort: Pulmonary effort is normal. No respiratory distress.  Abdominal:     General: Bowel sounds are normal. There is no distension.     Palpations: Abdomen is soft.     Tenderness: There is no abdominal tenderness.  Musculoskeletal:        General: No swelling.     Cervical back: Normal range of motion.     Right lower leg: No edema.     Left lower leg: No edema.  Skin:    General: Skin is warm.     Coloration: Skin is not jaundiced.  Neurological:     Mental Status: She is alert and oriented to person, place, and time.  Psychiatric:        Thought Content: Thought content normal.        Judgment: Judgment normal.     GI:  Lab Results: Recent Labs    06/17/21 1408 06/18/21 0438  WBC 9.2 9.1  HGB 12.2 11.0*  HCT 35.2* 31.7*  PLT 400 351   BMET Recent Labs    06/17/21 1408 06/18/21 0438  NA 129* 136  K 3.5 3.0*  CL 88* 99  CO2 27 27  GLUCOSE 117* 91  BUN 10 6*  CREATININE 0.86 0.59  CALCIUM 9.5 8.8*   LFT Recent Labs    06/18/21 0438  PROT 6.2*  ALBUMIN 4.0  AST 21  ALT 18  ALKPHOS 47  BILITOT 0.9   PT/INR No results for input(s): LABPROT, INR in the last 72  hours.   Studies/Results: CT Abdomen Pelvis W Contrast  Result Date: 06/17/2021 CLINICAL DATA:  Acute abdominal pain. EXAM: CT ABDOMEN AND PELVIS WITH CONTRAST TECHNIQUE: Multidetector CT imaging of the abdomen and pelvis was performed using the standard protocol following bolus administration of intravenous contrast. CONTRAST:  58mL OMNIPAQUE IOHEXOL 350 MG/ML SOLN COMPARISON:  None. FINDINGS: Lower chest: No acute abnormality. Hepatobiliary: There is intra and extrahepatic biliary ductal dilatation. Common bile duct measures 1 cm. No calcified gallstones are seen. There is no pericholecystic inflammation. There is a small enhancing lesion in the dome of the liver measuring 6 mm. No other liver lesions are identified. Pancreas: Unremarkable. No pancreatic ductal dilatation or surrounding inflammatory changes. Spleen: Normal in size without focal abnormality. Adrenals/Urinary Tract: There is some cortical scarring in the left kidney. The kidneys otherwise appear within normal limits. The bladder is not well evaluated secondary to streak artifact in the pelvis. Stomach/Bowel: Stomach is decompressed in gastric wall thickening cannot be excluded limits. Appendix appears normal. No evidence of bowel wall thickening, distention, or inflammatory changes. Vascular/Lymphatic: Aortic atherosclerosis. No enlarged abdominal or pelvic lymph nodes. Reproductive: Structures in the pelvis are not well evaluated secondary to streak artifact from the hips. Other: There is no ascites or free air. No focal abdominal wall hernia. Musculoskeletal: There are degenerative changes at L5-S1. bilateral hip arthroplasties are present. IMPRESSION: 1. Intra and extrahepatic biliary ductal dilatation. No calcified gallstones are present. Findings are concerning for distal biliary obstruction. Recommend correlation with lab values. This can be further evaluated with ultrasound, MRCP or ERCP. 2. Gastric wall thickening versus normal under  distension. Correlate for gastritis. 3.  Aortic Atherosclerosis (ICD10-I70.0). Electronically Signed   By:  Darliss Cheney M.D.   On: 06/17/2021 18:10   DG Abd Acute W/Chest  Result Date: 06/17/2021 CLINICAL DATA:  Abdominal pain for 2 months.  Vomiting. EXAM: DG ABDOMEN ACUTE WITH 1 VIEW CHEST COMPARISON:  None. FINDINGS: There is no evidence of dilated bowel loops or free intraperitoneal air. No radiopaque calculi or other significant radiographic abnormality is seen. Heart size and mediastinal contours are within normal limits. Both lungs are clear. IMPRESSION: No abnormal bowel dilatation.  No acute cardiopulmonary disease. Electronically Signed   By: Lupita Raider M.D.   On: 06/17/2021 14:48    Impression/Plan: -Nausea and vomiting after taking Suprep for colonoscopy on Wednesday.  Improving now.  CT scan showed biliary ductal dilated Tat[place].  Follow-up MRI MRCP also showed biliary dilation without any evidence of choledocholithiasis or mass lesion.  LFTs are normal.  Normal lipase  -Abnormal CT scan concerning for gastritis.  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  She was prescribed Protonix as an outpatient.  -Normal MRI showing 10 mm liver lesion.  Probably hemangioma.  They have recommended repeat MRI in 3 to 6 months.  Recommendations ----------------------- -Patient is feeling better now. -With normal LFTs and absence of abdominal pain, I do not see any need for ERCP at this time -She may benefit from outpatient EUS for biliary ductal dilatation or repeat MRI in 3 months for follow-up on biliary dilation as well as for follow-up on liver lesion .this was discussed with the patient. -Okay to discharge from GI standpoint if tolerating soft diet. -Recommend prescribing her Zofran to take as needed and Protonix 40 mg twice a day for gastric ulcers. -She  has follow-up with her GI doctor as an outpatient on  June 26, 2021. -GI will sign off.  Call us back if needed   LOS: 1 day   Kathi Der  MD, FACP 06/18/2021, 7:33 AM  Contact #  [phone]

## 2021-06-18 NOTE — Progress Notes (Signed)
Nurse reviewed discharge instructions with pt. Pt verbalized understanding of discharge instructions, follow up appointment and new medication.  No concerns at time of discharge. 

## 2021-06-18 NOTE — Discharge Summary (Signed)
Physician Discharge Summary  Ismerai Bin IRS:[id number] DOB: 28-Jan-1946 DOA: 06/17/2021  PCP: Salli Real, MD  Admit date: 06/17/2021 Discharge date: 06/18/2021  Admitted From: Home Disposition:  Home  Discharge Condition:Stable CODE STATUS:FULL Diet recommendation: Heart Healthy   Brief/Interim Summary: Patient is a 9 female with history of GERD, anemia, hiatus hernia, arthritis, depression, hypothyroidism who presented with intractable nausea and vomiting.  Ongoing for 3 days .  She was also complaining of epigastric discomfort.  CT abdomen done in the emergency department showed intra-/extrahepatic biliary duct dilation, possible gastric wall thickening.  GI consulted.  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 No mass lesion identified in the head of the pancreas or at the ampulla.  GI recommended outpatient follow-up with her gastroenterologist and cleared for discharge.  She is medically stable for discharge to home today.  Following problems were addressed during her hospitalization:  Intractable nausea/vomiting: Improved. She was also complaining of epigastric discomfort,now resolved.  CT abdomen done in the emergency department showed intra-/extrahepatic biliary duct dilation, possible gastric wall thickening.  GI consulted.  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 No mass lesion identified in the head of the pancreas or at the ampulla.  She may benefit from outpatient EUS for biliary ductal dilatation or repeat MRI in 3 months for follow-up on biliary dilation as well as for follow-up on liver lesion.  She has an appointment  with her gastroenterologist on September 25. She tolerated solid food.   Hypertension: Currently blood pressure stable.  Continue current medications   Depression: Continue Cymbalta   Hyperlipidemia: On Zocor   Chronic pain syndrome/arthritis: Takes opiates at home.   History of GERD/hiatal hernia: On Nexium at home.  She has history of gastric ulcers.  We recommend PPI twice daily, patient has been prescribed Nexium and Zofran.    Discharge Diagnoses:  Principal Problem:   Intractable nausea and vomiting Active Problems:   GERD (gastroesophageal reflux disease)   Hiatal hernia   Depression   Headache    Discharge Instructions  Discharge Instructions     Diet - low sodium heart healthy   Complete by: As directed    Discharge instructions   Complete by: As directed    1) Please follow-up with your gastroenterologist as an outpatient.  Outpatient EUS for biliary ductal dilatation or repeat MRI in 3 months for follow-up on biliary dilation as well as for follow-up on liver lesion .  2)Please take prescribed medications as instructed   Increase activity slowly   Complete by: As directed       Allergies as of 06/18/2021       Reactions   Fish Allergy Anaphylaxis, Hives   Only shrimp   Adhesive [tape] Other (See Comments)   Skin tears/redness. Cloth/paper tape tolerates   Ampicillin Hives, Diarrhea   TOLERATES ORALLY NOT INTRAVENOUS  Has patient had a PCN reaction causing immediate rash, facial/tongue/throat swelling, SOB or lightheadedness with hypotension:No Has patient had a PCN reaction causing severe rash involving mucus membranes or skin necrosis:Unknown Has patient had a PCN reaction that required hospitalization:Inpatient when reaction occurred. Has patient had a PCN reaction occurring within the last 10 years: No If all of the  above answers are "NO", then may proceed with Cephalosporin   Latex Other (See Comments)   Skin redness/peels skin         Medication List     STOP taking these medications    aspirin 81 MG chewable tablet   HYDROmorphone 2 MG tablet Commonly known as: Dilaudid       TAKE these medications    acetaminophen 500 MG tablet Commonly known as: TYLENOL Take 500 mg by mouth every 6 (six) hours as needed for headache.   amLODipine 5 MG tablet Commonly known as: NORVASC Take 5 mg by mouth daily.   DULoxetine 30 MG capsule Commonly known as: CYMBALTA Take 30-60 mg by mouth 2 (two) times daily. 60 mg in the morning, then 30 mg at night.   lisinopril-hydrochlorothiazide 10-12.5 MG tablet Commonly known as: ZESTORETIC Take 1 tablet by mouth daily.   methocarbamol 500 MG tablet Commonly known as: ROBAXIN Take 1 tablet (500 mg total) by mouth every 6 (six) hours as needed for muscle spasms.   Movantik 25 MG Tabs tablet Generic drug: naloxegol oxalate Take 25 mg by mouth daily.   ondansetron 4 MG tablet Commonly known as: ZOFRAN Take 1 tablet (4 mg total) by mouth every 6 (six) hours as needed for nausea.   oxyCODONE-acetaminophen 10-325 MG tablet Commonly known as: PERCOCET Take 1 tablet by mouth every 6 (six) hours as needed for pain.   pantoprazole 40 MG tablet Commonly known as: PROTONIX Take 40 mg by mouth 2 (two) times daily.   simvastatin 10 MG tablet Commonly known as: ZOCOR Take 10 mg by mouth every evening.   Vitamin D 50 MCG (2000 UT) Caps Take 2,000 Units by mouth daily.   zolpidem 10 MG tablet Commonly known as: AMBIEN Take 10 mg by mouth at bedtime as needed for sleep.        Follow-up Information     Salli Real, MD. Schedule an appointment as soon as possible for a visit in 1 week(s).   Specialty: Internal Medicine Contact information: [address] [place] [address] [phone]                Allergies  Allergen Reactions   Fish Allergy Anaphylaxis and Hives    Only shrimp   Adhesive [Tape] Other (See Comments)    Skin  tears/redness. Cloth/paper tape tolerates   Ampicillin Hives and Diarrhea    TOLERATES ORALLY NOT INTRAVENOUS  Has patient had a PCN reaction causing immediate rash, facial/tongue/throat swelling, SOB or lightheadedness with hypotension:No Has patient had a PCN reaction causing severe rash involving mucus membranes or skin necrosis:Unknown Has patient had a PCN reaction that required hospitalization:Inpatient when reaction occurred. Has patient had a PCN reaction occurring within the last 10 years: No If all of the above answers are "NO", then may proceed with Cephalosporin   Latex Other (See Comments)    Skin redness/peels skin    Consultations: Gastroenterology   Procedures/Studies: CT Abdomen Pelvis W Contrast  Result Date: 06/17/2021 CLINICAL DATA:  Acute abdominal pain. EXAM: CT ABDOMEN AND PELVIS WITH CONTRAST TECHNIQUE: Multidetector CT imaging of the abdomen and pelvis was performed using the standard protocol following bolus administration of intravenous contrast. CONTRAST:  77mL OMNIPAQUE IOHEXOL 350 MG/ML SOLN COMPARISON:  None. FINDINGS: Lower chest: No acute abnormality. Hepatobiliary: There is intra and extrahepatic biliary ductal dilatation. Common bile duct measures 1 cm. No calcified gallstones are seen. There is no pericholecystic inflammation. There is a small enhancing lesion in  the dome of the liver measuring 6 mm. No other liver lesions are identified. Pancreas: Unremarkable. No pancreatic ductal dilatation or surrounding inflammatory changes. Spleen: Normal in size without focal abnormality. Adrenals/Urinary Tract: There is some cortical scarring in the left kidney. The kidneys otherwise appear within normal limits. The bladder is not well evaluated secondary to streak artifact in the pelvis. Stomach/Bowel: Stomach is decompressed in gastric wall thickening cannot be excluded limits. Appendix appears normal. No evidence of bowel wall thickening, distention, or inflammatory  changes. Vascular/Lymphatic: Aortic atherosclerosis. No enlarged abdominal or pelvic lymph nodes. Reproductive: Structures in the pelvis are not well evaluated secondary to streak artifact from the hips. Other: There is no ascites or free air. No focal abdominal wall hernia. Musculoskeletal: There are degenerative changes at L5-S1. bilateral hip arthroplasties are present. IMPRESSION: 1. Intra and extrahepatic biliary ductal dilatation. No calcified gallstones are present. Findings are concerning for distal biliary obstruction. Recommend correlation with lab values. This can be further evaluated with ultrasound, MRCP or ERCP. 2. Gastric wall thickening versus normal under distension. Correlate for gastritis. 3.  Aortic Atherosclerosis (ICD10-I70.0). Electronically Signed   By: Darliss Cheney M.D.   On: 06/17/2021 18:10   MR 3D Recon At Scanner  Result Date: 06/18/2021 CLINICAL DATA:  Biliary dilatation. EXAM: MRI ABDOMEN WITHOUT AND WITH CONTRAST (INCLUDING MRCP) TECHNIQUE: Multiplanar multisequence MR imaging of the abdomen was performed both before and after the administration of intravenous contrast. Heavily T2-weighted images of the biliary and pancreatic ducts were obtained, and three-dimensional MRCP images were rendered by post processing. CONTRAST:  5.44mL GADAVIST GADOBUTROL 1 MMOL/ML IV SOLN; 5.88mL GADAVIST GADOBUTROL 1 MMOL/ML IV SOLN, 28mL OMNIPAQUE IOHEXOL 350 MG/ML SOLN COMPARISON:  CT scan 06/17/2021 FINDINGS: Lower chest: Unremarkable. Hepatobiliary: 10 mm subcapsular lesion in the posterior hepatic dome shows low signal intensity on precontrast imaging and intermediate to high signal intensity on T2 imaging. Circumferential rim enhancement noted on arterial phase imaging with homogeneous diffuse enhancement by 45 seconds. No other focal liver lesion evident. No evidence for gallstones. Common duct dilated up to 13 mm. Common bile duct measures 8 mm diameter in the head of the pancreas just  proximal to the ampulla, upper normal for patient age. Pancreas: Diffuse prominence of the main pancreatic duct measuring up to 3 mm. Overlying pancreatic parenchymal atrophy evident. No evidence for mass lesion in the head of the pancreas or at the ampulla. Spleen:  No splenomegaly. No focal mass lesion. Adrenals/Urinary Tract: No adrenal nodule or mass. Kidneys unremarkable. Stomach/Bowel: Stomach is nondistended. Duodenum is normally positioned as is the ligament of Treitz. No small bowel or colonic dilatation within the visualized abdomen. Vascular/Lymphatic: No abdominal aortic aneurysm. No abdominal lymphadenopathy Other:  No intraperitoneal free fluid. Musculoskeletal: No focal suspicious marrow enhancement within the visualized bony anatomy. IMPRESSION: 1. 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 Follow-up MRI in 3-6 months recommended to ensure stability. 2. Mild extrahepatic biliary duct to the level of the ampulla main pancreatic duct diameter is upper normal. No evidence for choledocholithiasis. No mass lesion identified in the head of the pancreas or at the ampulla. ERCP may be warranted to further evaluate. Electronically Signed   By: Kennith Center M.D.   On: 06/18/2021 09:55   DG Abd Acute W/Chest  Result Date: 06/17/2021 CLINICAL DATA:  Abdominal pain for 2 months.  Vomiting. EXAM: DG ABDOMEN ACUTE WITH 1 VIEW CHEST COMPARISON:  None. FINDINGS: There is no evidence of dilated bowel loops or free intraperitoneal air. No radiopaque calculi or other significant radiographic abnormality is seen. Heart size and mediastinal contours are within normal limits. Both lungs are clear. IMPRESSION: No abnormal bowel dilatation.  No acute cardiopulmonary disease. Electronically Signed   By: Lupita Raider M.D.   On: 06/17/2021 14:48   MR ABDOMEN MRCP W WO CONTAST  Result Date: 06/18/2021 CLINICAL  DATA:  Biliary dilatation. EXAM: MRI ABDOMEN WITHOUT AND WITH CONTRAST (INCLUDING MRCP) TECHNIQUE: Multiplanar multisequence MR imaging of the abdomen was performed both before and after the administration of intravenous contrast. Heavily T2-weighted images of the biliary and pancreatic ducts were obtained, and three-dimensional MRCP images were rendered by post processing. CONTRAST:  5.78mL GADAVIST GADOBUTROL 1 MMOL/ML IV SOLN; 5.14mL GADAVIST GADOBUTROL 1 MMOL/ML IV SOLN, 49mL OMNIPAQUE IOHEXOL 350 MG/ML SOLN COMPARISON:  CT scan 06/17/2021 FINDINGS: Lower chest: Unremarkable. Hepatobiliary: 10 mm subcapsular lesion in the posterior hepatic dome shows low signal intensity on precontrast imaging and intermediate to high signal intensity on T2 imaging. Circumferential rim enhancement noted on arterial phase imaging with homogeneous diffuse enhancement by 45 seconds. No other focal liver lesion evident. No evidence for gallstones. Common duct dilated up to 13 mm. Common bile duct measures 8 mm diameter in the head of the pancreas just proximal to the ampulla, upper normal for patient age. Pancreas: Diffuse prominence of the main pancreatic duct measuring up to 3 mm. Overlying pancreatic parenchymal atrophy evident. No evidence for mass lesion in the head of the pancreas or at the ampulla. Spleen:  No splenomegaly. No focal mass lesion. Adrenals/Urinary Tract: No adrenal nodule or mass. Kidneys unremarkable. Stomach/Bowel: Stomach is nondistended. Duodenum is normally positioned as is the ligament of Treitz. No small bowel or colonic dilatation within the visualized abdomen. Vascular/Lymphatic: No abdominal aortic aneurysm. No abdominal lymphadenopathy Other:  No intraperitoneal free fluid. Musculoskeletal: No focal suspicious marrow enhancement within the visualized bony anatomy. IMPRESSION: 1. 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 Follow-up MRI in 3-6 months recommended to ensure stability. 2. Mild extrahepatic biliary duct to the level of the ampulla main pancreatic duct diameter is upper normal. No evidence for choledocholithiasis. No mass lesion identified in the head of the pancreas or at the ampulla. ERCP may be warranted to further evaluate. Electronically Signed   By: Kennith Center M.D.   On: 06/18/2021 09:55      Subjective: Patient seen and examined the bedside this morning.  Hemodynamically stable for discharge today  Discharge Exam: Vitals:   06/18/21 0222 06/18/21 0626  BP: (!) 148/54 (!) 155/82  Pulse: 76 84  Resp: 18 18  Temp: 98.4 F (36.9 C) 99.1 F (37.3 C)  SpO2: 100% 98%   Vitals:   06/17/21 2000 06/17/21 2345 06/18/21 0222 06/18/21 0626  BP: (!) 145/56 (!) 150/74 (!) 148/54 (!) 155/82  Pulse: 78 81 76 84  Resp: 18 18 18 18   Temp:  98.4 F (36.9 C) 98.4 F (36.9 C) 99.1 F (37.3 C)  TempSrc:  Oral Oral Oral  SpO2: 99% 100% 100% 98%  Weight:      Height:        General: Pt is alert, awake, not in acute distress Cardiovascular: RRR, S1/S2 +, no rubs, no gallops Respiratory: CTA bilaterally, no wheezing, no rhonchi Abdominal: Soft, NT, ND, bowel sounds + Extremities: no edema,  no cyanosis    The results of significant diagnostics from this hospitalization (including imaging, microbiology, ancillary and laboratory) are listed below for reference.     Microbiology: Recent Results (from the past 240 hour(s))  Resp Panel by RT-PCR (Flu A&B, Covid) Nasopharyngeal Swab     Status: None   Collection Time: 06/17/21 10:07 PM   Specimen: Nasopharyngeal Swab; Nasopharyngeal(NP) swabs in vial transport medium  Result Value Ref Range Status   SARS Coronavirus 2 by RT PCR NEGATIVE NEGATIVE Final    Comment: (NOTE) SARS-CoV-2 target nucleic acids are NOT DETECTED.  The SARS-CoV-2 RNA is generally detectable in upper respiratory specimens during the acute  phase of infection. The lowest concentration of SARS-CoV-2 viral copies this assay can detect is 138 copies/mL. A negative result does not preclude SARS-Cov-2 infection and should not be used as the sole basis for treatment or other patient management decisions. A negative result may occur with  improper specimen collection/handling, submission of specimen other than nasopharyngeal swab, presence of viral mutation(s) within the areas targeted by this assay, and inadequate number of viral copies(<138 copies/mL). A negative result must be combined with clinical observations, patient history, and epidemiological information. The expected result is Negative.  Fact Sheet for Patients:  BloggerCourse.com  Fact Sheet for Healthcare Providers:  SeriousBroker.it  This test is no t yet approved or cleared by the [place] FDA and  has been authorized for detection and/or diagnosis of SARS-CoV-2 by FDA under an Emergency Use Authorization (EUA). This EUA will remain  in effect (meaning this test can be used) for the duration of the COVID-19 declaration under Section 564(b)(1) of the Act, 21 U.S.C.section 360bbb-3(b)(1), unless the authorization is terminated  or revoked sooner.       Influenza A by PCR NEGATIVE NEGATIVE Final   Influenza B by PCR NEGATIVE NEGATIVE Final    Comment: (NOTE) The Xpert Xpress SARS-CoV-2/FLU/RSV plus assay is intended as an aid in the diagnosis of influenza from Nasopharyngeal swab specimens and should not be used as a sole basis for treatment. Nasal washings and aspirates are unacceptable for Xpert Xpress SARS-CoV-2/FLU/RSV testing.  Fact Sheet for Patients: BloggerCourse.com  Fact Sheet for Healthcare Providers: SeriousBroker.it  This test is not yet approved or cleared by the [place] FDA and has been authorized for detection and/or diagnosis of  SARS-CoV-2 by FDA under an Emergency Use Authorization (EUA). This EUA will remain in effect (meaning this test can be used) for the duration of the COVID-19 declaration under Section 564(b)(1) of the Act, 21 U.S.C. section 360bbb-3(b)(1), unless the authorization is terminated or revoked.  Performed at [facility], [address], [place], [place] [address]      Labs: BNP (last 3 results) No results for input(s): BNP in the last 8760 hours. Basic Metabolic Panel: Recent Labs  Lab 06/17/21 1408 06/18/21 0438  NA 129* 136  K 3.5 3.0*  CL 88* 99  CO2 27 27  GLUCOSE 117* 91  BUN 10 6*  CREATININE 0.86 0.59  CALCIUM 9.5 8.8*  MG  --  1.7   Liver Function Tests: Recent Labs  Lab 06/17/21 1408 06/18/21 0438  AST 26 21  ALT 21 18  ALKPHOS 57 47  BILITOT 1.1 0.9  PROT 7.3 6.2*  ALBUMIN 4.6 4.0   Recent Labs  Lab 06/17/21 1408  LIPASE 33   No results for input(s): AMMONIA in the last 168 hours. CBC: Recent Labs  Lab 06/17/21 1408 06/18/21 0438  WBC 9.2 9.1  NEUTROABS 7.9*  --   HGB 12.2 11.0*  HCT 35.2* 31.7*  MCV 93.9 94.1  PLT 400 351   Cardiac Enzymes: No results for input(s): CKTOTAL, CKMB, CKMBINDEX, TROPONINI in the last 168 hours. BNP: Invalid input(s): POCBNP CBG: No results for input(s): GLUCAP in the last 168 hours. D-Dimer No results for input(s): DDIMER in the last 72 hours. Hgb A1c No results for input(s): HGBA1C in the last 72 hours. Lipid Profile No results for input(s): CHOL, HDL, LDLCALC, TRIG, CHOLHDL, LDLDIRECT in the last 72 hours. Thyroid function studies No results for input(s): TSH, T4TOTAL, T3FREE, THYROIDAB in the last 72 hours.  Invalid input(s): FREET3 Anemia work up No results for input(s): VITAMINB12, FOLATE, FERRITIN, TIBC, IRON, RETICCTPCT in the last 72 hours. Urinalysis    Component Value Date/Time   COLORURINE YELLOW (A) 06/17/2021 1829   APPEARANCEUR CLEAR (A) 06/17/2021 1829   LABSPEC  <1.005 (L) 06/17/2021 1829   PHURINE 7.0 06/17/2021 1829   GLUCOSEU NEGATIVE 06/17/2021 1829   HGBUR TRACE (A) 06/17/2021 1829   BILIRUBINUR NEGATIVE 06/17/2021 1829   KETONESUR TRACE (A) 06/17/2021 1829   PROTEINUR NEGATIVE 06/17/2021 1829   NITRITE NEGATIVE 06/17/2021 1829   LEUKOCYTESUR NEGATIVE 06/17/2021 1829   Sepsis Labs Invalid input(s): PROCALCITONIN,  WBC,  LACTICIDVEN Microbiology Recent Results (from the past 240 hour(s))  Resp Panel by RT-PCR (Flu A&B, Covid) Nasopharyngeal Swab     Status: None   Collection Time: 06/17/21 10:07 PM   Specimen: Nasopharyngeal Swab; Nasopharyngeal(NP) swabs in vial transport medium  Result Value Ref Range Status   SARS Coronavirus 2 by RT PCR NEGATIVE NEGATIVE Final    Comment: (NOTE) SARS-CoV-2 target nucleic acids are NOT DETECTED.  The SARS-CoV-2 RNA is generally detectable in upper respiratory specimens during the acute phase of infection. The lowest concentration of SARS-CoV-2 viral copies this assay can detect is 138 copies/mL. A negative result does not preclude SARS-Cov-2 infection and should not be used as the sole basis for treatment or other patient management decisions. A negative result may occur with  improper specimen collection/handling, submission of specimen other than nasopharyngeal swab, presence of viral mutation(s) within the areas targeted by this assay, and inadequate number of viral copies(<138 copies/mL). A negative result must be combined with clinical observations, patient history, and epidemiological information. The expected result is Negative.  Fact Sheet for Patients:  BloggerCourse.com  Fact Sheet for Healthcare Providers:  SeriousBroker.it  This test is no t yet approved or cleared by the [place] FDA and  has been authorized for detection and/or diagnosis of SARS-CoV-2 by FDA under an Emergency Use Authorization (EUA). This EUA will remain   in effect (meaning this test can be used) for the duration of the COVID-19 declaration under Section 564(b)(1) of the Act, 21 U.S.C.section 360bbb-3(b)(1), unless the authorization is terminated  or revoked sooner.       Influenza A by PCR NEGATIVE NEGATIVE Final   Influenza B by PCR NEGATIVE NEGATIVE Final    Comment: (NOTE) The Xpert Xpress SARS-CoV-2/FLU/RSV plus assay is intended as an aid in the diagnosis of influenza from Nasopharyngeal swab specimens and should not be used as a sole basis for treatment. Nasal washings and aspirates are unacceptable for Xpert Xpress SARS-CoV-2/FLU/RSV testing.  Fact Sheet for Patients: BloggerCourse.com  Fact Sheet for Healthcare Providers: SeriousBroker.it  This test is not yet approved or cleared by the [place] FDA and has been authorized for detection and/or diagnosis of SARS-CoV-2 by  FDA under an Emergency Use Authorization (EUA). This EUA will remain in effect (meaning this test can be used) for the duration of the COVID-19 declaration under Section 564(b)(1) of the Act, 21 U.S.C. section 360bbb-3(b)(1), unless the authorization is terminated or revoked.  Performed at [facility], [address], [place], [place] [address]     Please note: You were cared for by a hospitalist during your hospital stay. Once you are discharged, your primary care physician will handle any further medical issues. Please note that NO REFILLS for any discharge medications will be authorized once you are discharged, as it is imperative that you return to your primary care physician (or establish a relationship with a primary care physician if you do not have one) for your post hospital discharge needs so that they can reassess your need for medications and monitor your lab values.    Time coordinating discharge: 40 minutes  SIGNED:   Burnadette Pop, MD  [facility] 06/18/2021, 1:59 PM Pager [phone]  If 7PM-7AM, please contact night-coverage [email or web address] Password [email or web address]

## 2021-06-18 NOTE — Care Management Obs Status (Signed)
MEDICARE OBSERVATION STATUS NOTIFICATION   Patient Details  Name: Kaitlin Little MRN: [id number] Date of Birth: Mar 29, 1946   Medicare Observation Status Notification Given:  Yes    Armanda Heritage, RN 06/18/2021, 1:19 PM

## 2021-12-29 ENCOUNTER — Encounter (HOSPITAL_COMMUNITY): Payer: Self-pay | Admitting: Radiology

## 2022-07-21 ENCOUNTER — Other Ambulatory Visit: Payer: Self-pay | Admitting: Gastroenterology

## 2022-07-21 DIAGNOSIS — K769 Liver disease, unspecified: Secondary | ICD-10-CM

## 2022-08-16 ENCOUNTER — Other Ambulatory Visit: Payer: Federal, State, Local not specified - PPO

## 2022-09-12 ENCOUNTER — Other Ambulatory Visit: Payer: Federal, State, Local not specified - PPO

## 2022-10-01 ENCOUNTER — Ambulatory Visit
Admission: RE | Admit: 2022-10-01 | Discharge: 2022-10-01 | Disposition: A | Discharge status: Home / Self Care | Payer: Medicare Other | Source: Ambulatory Visit | Attending: Gastroenterology | Admitting: Gastroenterology

## 2022-10-01 DIAGNOSIS — K769 Liver disease, unspecified: Secondary | ICD-10-CM

## 2022-10-01 MED ORDER — GADOPICLENOL 0.5 MMOL/ML IV SOLN
6.0000 mL | Freq: Once | INTRAVENOUS | Status: AC | PRN
  Administered 2022-10-01: 6 mL via INTRAVENOUS

## 2022-12-17 ENCOUNTER — Other Ambulatory Visit: Payer: Self-pay

## 2022-12-17 ENCOUNTER — Emergency Department (HOSPITAL_COMMUNITY): Payer: Medicare Other

## 2022-12-17 ENCOUNTER — Inpatient Hospital Stay (HOSPITAL_COMMUNITY)
Admission: EM | Admit: 2022-12-17 | Discharge: 2022-12-23 | DRG: 391 | Disposition: A | Discharge status: Home / Self Care | Payer: Medicare Other | Attending: Internal Medicine | Admitting: Internal Medicine

## 2022-12-17 DIAGNOSIS — A084 Viral intestinal infection, unspecified: Principal | ICD-10-CM | POA: Diagnosis present

## 2022-12-17 DIAGNOSIS — K56600 Partial intestinal obstruction, unspecified as to cause: Secondary | ICD-10-CM | POA: Diagnosis present

## 2022-12-17 DIAGNOSIS — Z79899 Other long term (current) drug therapy: Secondary | ICD-10-CM | POA: Diagnosis not present

## 2022-12-17 DIAGNOSIS — K529 Noninfective gastroenteritis and colitis, unspecified: Secondary | ICD-10-CM | POA: Diagnosis not present

## 2022-12-17 DIAGNOSIS — E860 Dehydration: Secondary | ICD-10-CM | POA: Diagnosis present

## 2022-12-17 DIAGNOSIS — R112 Nausea with vomiting, unspecified: Secondary | ICD-10-CM | POA: Diagnosis not present

## 2022-12-17 DIAGNOSIS — N179 Acute kidney failure, unspecified: Secondary | ICD-10-CM

## 2022-12-17 DIAGNOSIS — E039 Hypothyroidism, unspecified: Secondary | ICD-10-CM | POA: Diagnosis present

## 2022-12-17 DIAGNOSIS — I10 Essential (primary) hypertension: Secondary | ICD-10-CM | POA: Diagnosis present

## 2022-12-17 DIAGNOSIS — Z91048 Other nonmedicinal substance allergy status: Secondary | ICD-10-CM | POA: Diagnosis not present

## 2022-12-17 DIAGNOSIS — G894 Chronic pain syndrome: Secondary | ICD-10-CM | POA: Diagnosis present

## 2022-12-17 DIAGNOSIS — K5901 Slow transit constipation: Secondary | ICD-10-CM | POA: Diagnosis not present

## 2022-12-17 DIAGNOSIS — E872 Acidosis, unspecified: Secondary | ICD-10-CM | POA: Diagnosis present

## 2022-12-17 DIAGNOSIS — E871 Hypo-osmolality and hyponatremia: Secondary | ICD-10-CM | POA: Diagnosis present

## 2022-12-17 DIAGNOSIS — F32A Depression, unspecified: Secondary | ICD-10-CM | POA: Diagnosis present

## 2022-12-17 DIAGNOSIS — Z91013 Allergy to seafood: Secondary | ICD-10-CM | POA: Diagnosis not present

## 2022-12-17 DIAGNOSIS — E785 Hyperlipidemia, unspecified: Secondary | ICD-10-CM | POA: Diagnosis present

## 2022-12-17 DIAGNOSIS — Z91040 Latex allergy status: Secondary | ICD-10-CM | POA: Diagnosis not present

## 2022-12-17 DIAGNOSIS — Z96641 Presence of right artificial hip joint: Secondary | ICD-10-CM | POA: Diagnosis present

## 2022-12-17 DIAGNOSIS — K219 Gastro-esophageal reflux disease without esophagitis: Secondary | ICD-10-CM | POA: Diagnosis present

## 2022-12-17 DIAGNOSIS — K589 Irritable bowel syndrome without diarrhea: Secondary | ICD-10-CM | POA: Diagnosis present

## 2022-12-17 DIAGNOSIS — Z96659 Presence of unspecified artificial knee joint: Secondary | ICD-10-CM | POA: Diagnosis present

## 2022-12-17 DIAGNOSIS — K567 Ileus, unspecified: Secondary | ICD-10-CM | POA: Diagnosis present

## 2022-12-17 DIAGNOSIS — N170 Acute kidney failure with tubular necrosis: Secondary | ICD-10-CM | POA: Diagnosis present

## 2022-12-17 DIAGNOSIS — Z87891 Personal history of nicotine dependence: Secondary | ICD-10-CM

## 2022-12-17 DIAGNOSIS — Z880 Allergy status to penicillin: Secondary | ICD-10-CM

## 2022-12-17 DIAGNOSIS — Z8711 Personal history of peptic ulcer disease: Secondary | ICD-10-CM

## 2022-12-17 DIAGNOSIS — E876 Hypokalemia: Secondary | ICD-10-CM | POA: Diagnosis not present

## 2022-12-17 DIAGNOSIS — R935 Abnormal findings on diagnostic imaging of other abdominal regions, including retroperitoneum: Secondary | ICD-10-CM | POA: Diagnosis not present

## 2022-12-17 DIAGNOSIS — M1990 Unspecified osteoarthritis, unspecified site: Secondary | ICD-10-CM | POA: Diagnosis present

## 2022-12-17 DIAGNOSIS — R197 Diarrhea, unspecified: Secondary | ICD-10-CM | POA: Diagnosis not present

## 2022-12-17 DIAGNOSIS — R140 Abdominal distension (gaseous): Secondary | ICD-10-CM | POA: Diagnosis not present

## 2022-12-17 DIAGNOSIS — R1084 Generalized abdominal pain: Secondary | ICD-10-CM | POA: Diagnosis not present

## 2022-12-17 DIAGNOSIS — K922 Gastrointestinal hemorrhage, unspecified: Secondary | ICD-10-CM | POA: Diagnosis present

## 2022-12-17 LAB — OSMOLALITY, URINE: Osmolality, Ur: 332 mOsm/kg (ref 300–900)

## 2022-12-17 LAB — URINALYSIS, ROUTINE W REFLEX MICROSCOPIC
Bacteria, UA: NONE SEEN
Bilirubin Urine: NEGATIVE
Glucose, UA: NEGATIVE mg/dL
Ketones, ur: NEGATIVE mg/dL
Nitrite: NEGATIVE
Protein, ur: 30 mg/dL — AB
Specific Gravity, Urine: 1.013 (ref 1.005–1.030)
pH: 5 (ref 5.0–8.0)

## 2022-12-17 LAB — CBC
HCT: 43.5 % (ref 36.0–46.0)
Hemoglobin: 15.1 g/dL — ABNORMAL HIGH (ref 12.0–15.0)
MCH: 30.7 pg (ref 26.0–34.0)
MCHC: 34.7 g/dL (ref 30.0–36.0)
MCV: 88.4 fL (ref 80.0–100.0)
Platelets: 386 10*3/uL (ref 150–400)
RBC: 4.92 MIL/uL (ref 3.87–5.11)
RDW: 14.3 % (ref 11.5–15.5)
WBC: 22.8 10*3/uL — ABNORMAL HIGH (ref 4.0–10.5)
nRBC: 0 % (ref 0.0–0.2)

## 2022-12-17 LAB — COMPREHENSIVE METABOLIC PANEL WITH GFR
ALT: 20 U/L (ref 0–44)
AST: 47 U/L — ABNORMAL HIGH (ref 15–41)
Albumin: 4.5 g/dL (ref 3.5–5.0)
Alkaline Phosphatase: 75 U/L (ref 38–126)
Anion gap: 24 — ABNORMAL HIGH (ref 5–15)
BUN: 38 mg/dL — ABNORMAL HIGH (ref 8–23)
CO2: 15 mmol/L — ABNORMAL LOW (ref 22–32)
Calcium: 9.7 mg/dL (ref 8.9–10.3)
Chloride: 77 mmol/L — ABNORMAL LOW (ref 98–111)
Creatinine, Ser: 3.03 mg/dL — ABNORMAL HIGH (ref 0.44–1.00)
GFR, Estimated: 16 mL/min — ABNORMAL LOW
Glucose, Bld: 160 mg/dL — ABNORMAL HIGH (ref 70–99)
Potassium: 3.8 mmol/L (ref 3.5–5.1)
Sodium: 116 mmol/L — CL (ref 135–145)
Total Bilirubin: 0.4 mg/dL (ref 0.3–1.2)
Total Protein: 7.2 g/dL (ref 6.5–8.1)

## 2022-12-17 LAB — MAGNESIUM: Magnesium: 1.8 mg/dL (ref 1.7–2.4)

## 2022-12-17 LAB — SODIUM
Sodium: 123 mmol/L — ABNORMAL LOW (ref 135–145)
Sodium: 123 mmol/L — ABNORMAL LOW (ref 135–145)

## 2022-12-17 LAB — LACTIC ACID, PLASMA: Lactic Acid, Venous: 1.6 mmol/L (ref 0.5–1.9)

## 2022-12-17 LAB — PHOSPHORUS: Phosphorus: 5.6 mg/dL — ABNORMAL HIGH (ref 2.5–4.6)

## 2022-12-17 LAB — POC OCCULT BLOOD, ED: Fecal Occult Bld: POSITIVE — AB

## 2022-12-17 LAB — GLUCOSE, CAPILLARY: Glucose-Capillary: 114 mg/dL — ABNORMAL HIGH (ref 70–99)

## 2022-12-17 LAB — OSMOLALITY: Osmolality: 261 mOsm/kg — ABNORMAL LOW (ref 275–295)

## 2022-12-17 LAB — SODIUM, URINE, RANDOM: Sodium, Ur: 33 mmol/L

## 2022-12-17 LAB — MRSA NEXT GEN BY PCR, NASAL: MRSA by PCR Next Gen: NOT DETECTED

## 2022-12-17 LAB — LIPASE, BLOOD: Lipase: 48 U/L (ref 11–51)

## 2022-12-17 MED ORDER — LACTATED RINGERS IV BOLUS
2000.0000 mL | Freq: Once | INTRAVENOUS | Status: AC
  Administered 2022-12-17: 2000 mL via INTRAVENOUS

## 2022-12-17 MED ORDER — PROCHLORPERAZINE EDISYLATE 10 MG/2ML IJ SOLN
10.0000 mg | Freq: Four times a day (QID) | INTRAMUSCULAR | Status: DC | PRN
  Administered 2022-12-18: 10 mg via INTRAVENOUS
  Filled 2022-12-17: qty 2

## 2022-12-17 MED ORDER — SODIUM BICARBONATE 8.4 % IV SOLN
100.0000 meq | Freq: Once | INTRAVENOUS | Status: AC
  Administered 2022-12-17: 100 meq via INTRAVENOUS
  Filled 2022-12-17: qty 50

## 2022-12-17 MED ORDER — PANTOPRAZOLE SODIUM 40 MG IV SOLR
40.0000 mg | Freq: Once | INTRAVENOUS | Status: AC
  Administered 2022-12-17: 40 mg via INTRAVENOUS
  Filled 2022-12-17: qty 10

## 2022-12-17 MED ORDER — DOCUSATE SODIUM 100 MG PO CAPS: 100.0000 mg | ORAL_CAPSULE | Freq: Two times a day (BID) | ORAL | Status: DC | PRN

## 2022-12-17 MED ORDER — MORPHINE SULFATE (PF) 4 MG/ML IV SOLN
4.0000 mg | Freq: Once | INTRAVENOUS | Status: AC
  Administered 2022-12-17: 4 mg via INTRAVENOUS
  Filled 2022-12-17: qty 1

## 2022-12-17 MED ORDER — MORPHINE SULFATE (PF) 2 MG/ML IV SOLN
2.0000 mg | INTRAVENOUS | Status: DC | PRN
  Administered 2022-12-17 (×2): 2 mg via INTRAVENOUS
  Filled 2022-12-17 (×2): qty 1

## 2022-12-17 MED ORDER — AMLODIPINE BESYLATE 10 MG PO TABS
10.0000 mg | ORAL_TABLET | Freq: Every day | ORAL | Status: DC
  Administered 2022-12-17 – 2022-12-23 (×7): 10 mg via ORAL
  Filled 2022-12-17 (×2): qty 1
  Filled 2022-12-17: qty 2
  Filled 2022-12-17 (×4): qty 1

## 2022-12-17 MED ORDER — HYDROMORPHONE HCL 1 MG/ML IJ SOLN
1.0000 mg | Freq: Once | INTRAMUSCULAR | Status: AC
  Administered 2022-12-17: 1 mg via INTRAVENOUS
  Filled 2022-12-17: qty 1

## 2022-12-17 MED ORDER — METRONIDAZOLE 500 MG/100ML IV SOLN
500.0000 mg | Freq: Two times a day (BID) | INTRAVENOUS | Status: DC
  Administered 2022-12-17: 500 mg via INTRAVENOUS
  Filled 2022-12-17: qty 100

## 2022-12-17 MED ORDER — SODIUM CHLORIDE 0.9 % IV SOLN
2.0000 g | INTRAVENOUS | Status: DC
  Administered 2022-12-17: 2 g via INTRAVENOUS
  Filled 2022-12-17: qty 20

## 2022-12-17 MED ORDER — HYDROMORPHONE HCL 1 MG/ML IJ SOLN
1.0000 mg | INTRAMUSCULAR | Status: DC | PRN
  Administered 2022-12-17 – 2022-12-19 (×8): 1 mg via INTRAVENOUS
  Filled 2022-12-17 (×8): qty 1

## 2022-12-17 MED ORDER — SIMVASTATIN 20 MG PO TABS
10.0000 mg | ORAL_TABLET | Freq: Every day | ORAL | Status: DC
  Administered 2022-12-17 – 2022-12-22 (×6): 10 mg via ORAL
  Filled 2022-12-17: qty 1
  Filled 2022-12-17: qty 2
  Filled 2022-12-17 (×4): qty 1

## 2022-12-17 MED ORDER — SODIUM CHLORIDE 0.9 % IV BOLUS: 2000.0000 mL | Freq: Once | INTRAVENOUS | Status: DC

## 2022-12-17 MED ORDER — SODIUM CHLORIDE 0.9 % IV BOLUS
1000.0000 mL | Freq: Once | INTRAVENOUS | Status: AC
  Administered 2022-12-17: 1000 mL via INTRAVENOUS

## 2022-12-17 MED ORDER — ONDANSETRON HCL 4 MG/2ML IJ SOLN
4.0000 mg | Freq: Four times a day (QID) | INTRAMUSCULAR | Status: DC | PRN
  Administered 2022-12-17 – 2022-12-22 (×5): 4 mg via INTRAVENOUS
  Filled 2022-12-17 (×5): qty 2

## 2022-12-17 MED ORDER — ONDANSETRON HCL 4 MG/2ML IJ SOLN
4.0000 mg | Freq: Once | INTRAMUSCULAR | Status: AC
  Administered 2022-12-17: 4 mg via INTRAVENOUS
  Filled 2022-12-17: qty 2

## 2022-12-17 MED ORDER — PANTOPRAZOLE SODIUM 40 MG IV SOLR
40.0000 mg | Freq: Two times a day (BID) | INTRAVENOUS | Status: DC
  Administered 2022-12-17 – 2022-12-18 (×3): 40 mg via INTRAVENOUS
  Filled 2022-12-17 (×4): qty 10

## 2022-12-17 MED ORDER — POLYETHYLENE GLYCOL 3350 17 G PO PACK: 17.0000 g | PACK | Freq: Every day | ORAL | Status: DC | PRN

## 2022-12-17 MED ORDER — CHLORHEXIDINE GLUCONATE CLOTH 2 % EX PADS
6.0000 | MEDICATED_PAD | Freq: Every day | CUTANEOUS | Status: DC
  Administered 2022-12-18 – 2022-12-23 (×5): 6 via TOPICAL

## 2022-12-17 MED ORDER — METOCLOPRAMIDE HCL 5 MG/ML IJ SOLN
10.0000 mg | Freq: Once | INTRAMUSCULAR | Status: AC
  Administered 2022-12-17: 10 mg via INTRAVENOUS
  Filled 2022-12-17: qty 2

## 2022-12-17 NOTE — ED Provider Notes (Signed)
[facility] Provider Note   CSN: [id number] Arrival date & time:        History {Add pertinent medical, surgical, social history, OB history to HPI:1} Chief Complaint  Patient presents with   GI Bleeding    Kaitlin Little is a 77 y.o. female with past medical history significant for acid reflux, gastric ulcers, with no previous history of intra-abdominal surgery who presents with concern for abdominal pain, nausea, vomiting, 2 days of diarrhea, dark red blood in stools.  She reports pain 10/10 on arrival.  She denies fever, chills, she reports pain is not significantly worsened or affected by eating.  She reports she has had decreased appetite over the last few days.  She reports that she is still passing gas.  HPI     Home Medications Prior to Admission medications   Medication Sig Start Date End Date Taking? Authorizing Provider  acetaminophen (TYLENOL) 500 MG tablet Take 500 mg by mouth every 6 (six) hours as needed for headache.    [provider]  amLODipine (NORVASC) 5 MG tablet Take 5 mg by mouth daily. 11/05/15   [provider]  Cholecalciferol (VITAMIN D) 2000 units CAPS Take 2,000 Units by mouth daily.    [provider]  DULoxetine (CYMBALTA) 30 MG capsule Take 30-60 mg by mouth 2 (two) times daily. 60 mg in the morning, then 30 mg at night. 05/16/21   [provider]  lisinopril-hydrochlorothiazide (ZESTORETIC) 10-12.5 MG tablet Take 1 tablet by mouth daily. 04/23/21   [provider]  methocarbamol (ROBAXIN) 500 MG tablet Take 1 tablet (500 mg total) by mouth every 6 (six) hours as needed for muscle spasms. 04/30/17   Mcarthur Rossetti, MD  MOVANTIK 25 MG TABS tablet Take 25 mg by mouth daily. 01/17/16   [provider]  ondansetron (ZOFRAN) 4 MG tablet Take 1 tablet (4 mg total) by mouth every 6 (six) hours as needed for nausea. 06/18/21   Shelly Coss, MD   oxyCODONE-acetaminophen (PERCOCET) 10-325 MG tablet Take 1 tablet by mouth every 6 (six) hours as needed for pain. 04/30/17   Mcarthur Rossetti, MD  pantoprazole (PROTONIX) 40 MG tablet Take 40 mg by mouth 2 (two) times daily. 06/11/21   [provider]  simvastatin (ZOCOR) 10 MG tablet Take 10 mg by mouth every evening.  12/11/15   [provider]  zolpidem (AMBIEN) 10 MG tablet Take 10 mg by mouth at bedtime as needed for sleep.    [provider]      Allergies    Fish allergy, Adhesive [tape], Ampicillin, and Latex    Review of Systems   Review of Systems  All other systems reviewed and are negative.   Physical Exam Updated Vital Signs BP (!) 178/102 (BP Location: Right Arm)   Pulse 84   Resp (!) 22   SpO2 100%  Physical Exam Vitals and nursing note reviewed.  Constitutional:      General: She is not in acute distress.    Appearance: Normal appearance.  HENT:     Head: Normocephalic and atraumatic.  Eyes:     General:        Right eye: No discharge.        Left eye: No discharge.  Cardiovascular:     Rate and Rhythm: Normal rate and regular rhythm.     Heart sounds: No murmur heard.    No friction rub. No gallop.  Pulmonary:  Effort: Pulmonary effort is normal.     Breath sounds: Normal breath sounds.  Abdominal:     General: Bowel sounds are normal.     Palpations: Abdomen is soft.     Comments: Patient with focal tenderness in lower quadrants, most focally in left lower quadrant, no rebound, rigidity, she does have some guarding.  Appropriate bowel sounds throughout all 4 quadrants  Skin:    General: Skin is warm and dry.     Capillary Refill: Capillary refill takes less than 2 seconds.  Neurological:     Mental Status: She is alert and oriented to person, place, and time.  Psychiatric:        Mood and Affect: Mood normal.        Behavior: Behavior normal.     ED Results / Procedures / Treatments   Labs (all labs ordered  are listed, but only abnormal results are displayed) Labs Reviewed - No data to display  EKG None  Radiology No results found.  Procedures Procedures  {Document cardiac monitor, telemetry assessment procedure when appropriate:1}  Medications Ordered in ED Medications - No data to display  ED Course/ Medical Decision Making/ A&P   {   Click here for ABCD2, HEART and other calculatorsREFRESH Note before signing :1}                          Medical Decision Making  This patient is a 77 y.o. female who presents to the ED for concern of ***, this involves an extensive number of treatment options, and is a complaint that carries with it a high risk of complications and morbidity. The emergent differential diagnosis prior to evaluation includes, but is not limited to,  *** . This is not an exhaustive differential.   Past Medical History / Co-morbidities / Social History: ***  Additional history: Chart reviewed. Pertinent results include: ***  Physical Exam: Physical exam performed. The pertinent findings include: ***  Lab Tests: I ordered, and personally interpreted labs.  The pertinent results include:  ***   Imaging Studies: I ordered imaging studies including ***. I independently visualized and interpreted imaging which showed ***. I agree with the radiologist interpretation.   Cardiac Monitoring:  The patient was maintained on a cardiac monitor.  My attending physician Dr. Marland Kitchen viewed and interpreted the cardiac monitored which showed an underlying rhythm of: ***. I agree with this interpretation.   Medications: I ordered medication including ***  for ***. Reevaluation of the patient after these medicines showed that the patient {resolved/improved/worsened:23923::"improved"}. I have reviewed the patients home medicines and have made adjustments as needed.  Consultations Obtained: I requested consultation with the ***,  and discussed lab and imaging findings as well as  pertinent plan - they recommend: ***   Disposition: After consideration of the diagnostic results and the patients response to treatment, I feel that *** .   ***emergency department workup does not suggest an emergent condition requiring admission or immediate intervention beyond what has been performed at this time. The plan is: ***. The patient is safe for discharge and has been instructed to return immediately for worsening symptoms, change in symptoms or any other concerns.  I discussed this case with my attending physician Dr. Marland Kitchen who cosigned this note including patient's presenting symptoms, physical exam, and planned diagnostics and interventions. Attending physician stated agreement with plan or made changes to plan which were implemented.    Final Clinical Impression(s) / ED  Diagnoses Final diagnoses:  None    Rx / DC Orders ED Discharge Orders     None

## 2022-12-17 NOTE — ED Notes (Addendum)
Critical sodium of 116 called from lab. [place] notified.

## 2022-12-17 NOTE — Progress Notes (Signed)
eLink Physician-Brief Progress Note Patient Name: Kaitlin Little DOB: 05/21/1946 MRN: [id number]   Date of Service  12/17/2022  HPI/Events of Note  Pt with diffuse abodminal pain, unrelieved by percocet and morphine.  PT had been given dilaudid earlier in the ED, which offered relief.   eICU Interventions  PRN dilaudid IV ordered for severe pain.  Will monitor response. Will continue to monitor closely.         [place] 12/17/2022, 8:25 PM

## 2022-12-17 NOTE — ED Triage Notes (Signed)
Pt from home with hx of gastric ulcers for eval of 3 days of abdominal pain with n/v and one day of dark red blood in stool.

## 2022-12-17 NOTE — ED Provider Notes (Signed)
I received this patient in handoff from previous PA.  Please see his note for original history and workup thus far.  On repeat history, patient is a 77 year old female with a history of peptic ulcer disease.  She is presenting today with 2 days worth of rectal bleeding.  She says that yesterday there was "a whole lot" of blood.  Originally bright red but began to be darker.  She denied any NSAID or alcohol use.  Does say that she has been nauseous, having some diarrhea and vomiting over these 2 days as well.  Endorsing severe abdominal pain, mostly periumbilically in LLQ.  Last night she was unable to sleep due to the pain and nausea.  She has not had any fevers but does endorse some diaphoresis.  Tells me that she takes Nexium and Carafate at home   Physical Exam  BP (!) 178/102 (BP Location: Right Arm)   Pulse 84   Resp (!) 22   SpO2 100%   Physical Exam Vitals and nursing note reviewed.  Constitutional:      Appearance: Normal appearance.  HENT:     Head: Normocephalic and atraumatic.  Eyes:     General: No scleral icterus.    Conjunctiva/sclera: Conjunctivae normal.  Pulmonary:     Effort: Pulmonary effort is normal. No respiratory distress.  Abdominal:     General: Abdomen is flat.     Palpations: Abdomen is soft.     Tenderness: There is abdominal tenderness (Periumbilical and LLQ).  Skin:    Findings: No rash.  Neurological:     Mental Status: She is alert.  Psychiatric:        Mood and Affect: Mood normal.     Procedures  .Critical Care  Performed by: Rhae Hammock, PA-C Authorized by: Rhae Hammock, PA-C   Critical care provider statement:    Critical care time (minutes):  30   Critical care time was exclusive of:  Separately billable procedures and treating other patients   Critical care was necessary to treat or prevent imminent or life-threatening deterioration of the following conditions:  Metabolic crisis   Critical care was time spent personally by  me on the following activities:  Development of treatment plan with patient or surrogate, discussions with consultants, discussions with primary provider, evaluation of patient's response to treatment, examination of patient, obtaining history from patient or surrogate, re-evaluation of patient's condition, review of old charts, pulse oximetry, ordering and review of laboratory studies and ordering and performing treatments and interventions   I assumed direction of critical care for this patient from another provider in my specialty: no     Care discussed with: admitting provider     ED Course / MDM    Medical Decision Making Amount and/or Complexity of Data Reviewed Labs: ordered. Radiology: ordered.  Risk Prescription drug management. Decision regarding hospitalization.   I viewed and interpreted patient's lab work.  The pertinent results are as follows: Sodium 116 Chloride 77 Creatinine 3, BUN 38, GFR 24 WBC 22.8 AST mildly elevated however normal liver function otherwise  Treatment: 2 fluid boluses.  She was originally given Zofran but continued to endorse nausea, given Reglan.  Pantoprazole for GI bleed and epigastric discomfort.  Had pain after previously dosed morphine, given Dilaudid.  MDM/disposition: 77 year old female presenting with rectal bleeding.  Also hyponatremic to 116.  AKI with creatinine of 3, 2 years ago her kidney function was normal.  I spoke with Whitney with critical care who will admit  the patient given her hyponatremia.  CT scan pending at this time.  Critical care will follow for the remainder of the patient's workup.        Darliss Ridgel 12/17/22 1633    Valarie Merino, MD 12/17/22 [phone]

## 2022-12-17 NOTE — ED Notes (Signed)
Patient transported to CT 

## 2022-12-17 NOTE — H&P (Addendum)
NAME:  Kaitlin Little, MRN:  [id number], DOB:  11-12-1945, LOS: 0 ADMISSION DATE:  12/17/2022, CONSULTATION DATE: 12/17/2022 REFERRING MD: Dene Gentry, CHIEF COMPLAINT: Abdominal pain and GI bleeding  History of Present Illness:  77 year old female with peptic ulcer disease, hypertension and hyperlipidemia who was brought into the emergency department with a complaint of abdominal pain, nausea, vomiting and dark stool per rectum for last 3 days.  As per patient she was in her usual state of health about 3 days ago when she started with sudden onset of abdominal pain which progressively got worse associated with nausea and vomiting and for last 2 days she has been having initially dark stool per rectum then followed by bright red bleeding.  She was unable to eat or drink anything for last 3 days, started feeling generalized weak with increasing abdominal pain, decided to come to the emergency department.  She denies any sick contact, fever, chills, chest pain or palpitation  Pertinent  Medical History   Past Medical History:  Diagnosis Date   Anemia    bleeding ulcers no blood transfusions   Arthritis    Depression    GERD (gastroesophageal reflux disease)    Headache    sinus headaches   History of hiatal hernia    Hypertension    Hyperthyroidism    benign nodules in neck no meds     Significant Hospital Events: Including procedures, antibiotic start and stop dates in addition to other pertinent events     Interim History / Subjective:    Objective   Blood pressure (!) 161/76, pulse (!) 103, resp. rate (!) 26, SpO2 98 %.        Intake/Output Summary (Last 24 hours) at 12/17/2022 1726 Last data filed at 12/17/2022 1557 Gross per 24 hour  Intake 1000 ml  Output --  Net 1000 ml   There were no vitals filed for this visit.  Examination: Physical exam: General: Elderly female, lying on the bed HEENT: [place]/AT, eyes anicteric.  CV dry mucus membranes Neuro: Alert, awake  following commands Chest: Coarse breath sounds, no wheezes or rhonchi Heart: Regular rate and rhythm, no murmurs or gallops Abdomen: Soft, diffuse slight tender, nondistended, bowel sounds present Skin: No rash  Labs and images were reviewed Of note serum sodium 116, hemoglobin 15 Sodium bicarbonate of 15 and serum creatinine of 3 CT abdomen pelvis consistent with severe inflammatory/infectious colitis  Resolved Hospital Problem list     Assessment & Plan:  Acute gastroenteritis Severe dehydration Patient started with abdominal pain, nausea and vomiting followed by diarrhea and then dark stool CT scan is consistent with inflammatory/infectious colitis Continue IV fluid Follow-up GI panel Continue IV ceftriaxone and metronidazole Follow-up stool culture Continue n.p.o. for now Continue pain meds with morphine  Hyponatremia/hypochloremia Due to poor oral intake, GI losses and HCTZ for hypertension I think her hyponatremia is acute in onset due to above-mentioned reasons Will continue with IV fluid with normal saline Monitor serum sodium every 6 hours Goal sodium correction of 8 to 10 mEq per 24 hours  High anion gap metabolic acidosis Acute kidney injury due to ischemic ATN in the setting of severe dehydration Continue IV fluid Monitor intake and output Will give 100 mEq of sodium bicarbonate Follow-up lactate Avoid nephrotoxic agents  Prior history of gastric ulcer Continue IV Protonix twice daily  Hypertension Patient stated she has been taking her antihypertensive meds Her blood pressure is elevated could be related to uncontrolled pain She is on amlodipine,  lisinopril and HCTZ Resume amlodipine, hold lisinopril and HCTZ in the setting of hyponatremia and AKI  Hyperlipidemia Continue Zocor  Best Practice (right click and "Reselect all SmartList Selections" daily)   Diet/type: NPO DVT prophylaxis: SCD GI prophylaxis: PPI Lines: N/A Foley:  N/A Code Status:   full code Last date of multidisciplinary goals of care discussion [3/17: Updated patient at bedside, decision was to continue full scope of care]  Labs   CBC: Recent Labs  Lab 12/17/22 1422  WBC 22.8*  HGB 15.1*  HCT 43.5  MCV 88.4  PLT [id number]    Basic Metabolic Panel: Recent Labs  Lab 12/17/22 1422  NA 116*  K 3.8  CL 77*  CO2 15*  GLUCOSE 160*  BUN 38*  CREATININE 3.03*  CALCIUM 9.7   GFR: CrCl cannot be calculated (Unknown ideal weight.). Recent Labs  Lab 12/17/22 1422  WBC 22.8*    Liver Function Tests: Recent Labs  Lab 12/17/22 1422  AST 47*  ALT 20  ALKPHOS 75  BILITOT 0.4  PROT 7.2  ALBUMIN 4.5   Recent Labs  Lab 12/17/22 1422  LIPASE 48   No results for input(s): "AMMONIA" in the last 168 hours.  ABG No results found for: "PHART", "PCO2ART", "PO2ART", "HCO3", "TCO2", "ACIDBASEDEF", "O2SAT"   Coagulation Profile: No results for input(s): "INR", "PROTIME" in the last 168 hours.  Cardiac Enzymes: No results for input(s): "CKTOTAL", "CKMB", "CKMBINDEX", "TROPONINI" in the last 168 hours.  HbA1C: No results found for: "HGBA1C"  CBG: No results for input(s): "GLUCAP" in the last 168 hours.  Review of Systems:   12 point review of systems significant for complaint mentioned HPI, rest is negative  Past Medical History:  She,  has a past medical history of Anemia, Arthritis, Depression, GERD (gastroesophageal reflux disease), Headache, History of hiatal hernia, Hypertension, and Hyperthyroidism.   Surgical History:   Past Surgical History:  Procedure Laterality Date   ABDOMINAL HYSTERECTOMY     partial   BACK SURGERY     lower   BREAST SURGERY     lumpectomy bil breast   FOOT SURGERY     bil bunions and straightening great toe   JOINT REPLACEMENT     Left hip right hip 04/27/17 Dr. Loletha Grayer blackman   knee arthroscopy     left   TOTAL HIP ARTHROPLASTY Right 04/27/2017   Procedure: RIGHT TOTAL HIP ARTHROPLASTY ANTERIOR APPROACH;  Surgeon:  Mcarthur Rossetti, MD;  Location: [facility];  Service: Orthopedics;  Laterality: Right;     Social History:   reports that she has quit smoking. Her smoking use included cigarettes. She smoked an average of .5 packs per day. She has never used smokeless tobacco. She reports current alcohol use of about 1.0 standard drink of alcohol per week. She reports that she does not use drugs.   Family History:  Her family history is not on file.   Allergies Allergies  Allergen Reactions   Fish Allergy Anaphylaxis and Hives    Only shrimp   Adhesive [Tape] Other (See Comments)    Skin tears/redness. Cloth/paper tape tolerates   Ampicillin Hives and Diarrhea    TOLERATES ORALLY NOT INTRAVENOUS  Has patient had a PCN reaction causing immediate rash, facial/tongue/throat swelling, SOB or lightheadedness with hypotension:No Has patient had a PCN reaction causing severe rash involving mucus membranes or skin necrosis:Unknown Has patient had a PCN reaction that required hospitalization:Inpatient when reaction occurred. Has patient had a PCN reaction occurring within the last  10 years: No If all of the above answers are "NO", then may proceed with Cephalosporin   Latex Other (See Comments)    Skin redness/peels skin     Home Medications  Prior to Admission medications   Medication Sig Start Date End Date Taking? Authorizing Provider  acetaminophen (TYLENOL) 500 MG tablet Take 500 mg by mouth every 6 (six) hours as needed for headache.    [provider]  amLODipine (NORVASC) 5 MG tablet Take 5 mg by mouth daily. 11/05/15   [provider]  Cholecalciferol (VITAMIN D) 2000 units CAPS Take 2,000 Units by mouth daily.    [provider]  DULoxetine (CYMBALTA) 30 MG capsule Take 30-60 mg by mouth 2 (two) times daily. 60 mg in the morning, then 30 mg at night. 05/16/21   [provider]  lisinopril-hydrochlorothiazide (ZESTORETIC) 10-12.5 MG tablet Take 1 tablet by  mouth daily. 04/23/21   [provider]  methocarbamol (ROBAXIN) 500 MG tablet Take 1 tablet (500 mg total) by mouth every 6 (six) hours as needed for muscle spasms. 04/30/17   Mcarthur Rossetti, MD  MOVANTIK 25 MG TABS tablet Take 25 mg by mouth daily. 01/17/16   [provider]  ondansetron (ZOFRAN) 4 MG tablet Take 1 tablet (4 mg total) by mouth every 6 (six) hours as needed for nausea. 06/18/21   Shelly Coss, MD  oxyCODONE-acetaminophen (PERCOCET) 10-325 MG tablet Take 1 tablet by mouth every 6 (six) hours as needed for pain. 04/30/17   Mcarthur Rossetti, MD  pantoprazole (PROTONIX) 40 MG tablet Take 40 mg by mouth 2 (two) times daily. 06/11/21   [provider]  simvastatin (ZOCOR) 10 MG tablet Take 10 mg by mouth every evening.  12/11/15   [provider]  zolpidem (AMBIEN) 10 MG tablet Take 10 mg by mouth at bedtime as needed for sleep.    [provider]     Critical care time:      This patient is critically ill with multiple organ system failure which requires frequent high complexity decision making, assessment, support, evaluation, and titration of therapies. This was completed through the application of advanced monitoring technologies and extensive interpretation of multiple databases.  During this encounter critical care time was devoted to patient care services described in this note for 40 minutes.    Jacky Kindle, MD [facility] See Amion for pager If no response to pager, please call [phone] until 7pm After 7pm, Please call E-link [phone]

## 2022-12-18 DIAGNOSIS — E871 Hypo-osmolality and hyponatremia: Secondary | ICD-10-CM | POA: Diagnosis not present

## 2022-12-18 DIAGNOSIS — N179 Acute kidney failure, unspecified: Secondary | ICD-10-CM | POA: Diagnosis not present

## 2022-12-18 LAB — CBC
HCT: 29.6 % — ABNORMAL LOW (ref 36.0–46.0)
HCT: 29.6 % — ABNORMAL LOW (ref 36.0–46.0)
Hemoglobin: 10.9 g/dL — ABNORMAL LOW (ref 12.0–15.0)
Hemoglobin: 10.7 g/dL — ABNORMAL LOW (ref 12.0–15.0)
MCH: 31.9 pg (ref 26.0–34.0)
MCH: 31.2 pg (ref 26.0–34.0)
MCHC: 36.8 g/dL — ABNORMAL HIGH (ref 30.0–36.0)
MCHC: 36.1 g/dL — ABNORMAL HIGH (ref 30.0–36.0)
MCV: 86.5 fL (ref 80.0–100.0)
MCV: 86.3 fL (ref 80.0–100.0)
Platelets: 238 10*3/uL (ref 150–400)
Platelets: 243 10*3/uL (ref 150–400)
RBC: 3.42 MIL/uL — ABNORMAL LOW (ref 3.87–5.11)
RBC: 3.43 MIL/uL — ABNORMAL LOW (ref 3.87–5.11)
RDW: 14.4 % (ref 11.5–15.5)
RDW: 14.2 % (ref 11.5–15.5)
WBC: 11.1 10*3/uL — ABNORMAL HIGH (ref 4.0–10.5)
WBC: 10 10*3/uL (ref 4.0–10.5)
nRBC: 0 % (ref 0.0–0.2)
nRBC: 0 % (ref 0.0–0.2)

## 2022-12-18 LAB — PHOSPHORUS: Phosphorus: 4.2 mg/dL (ref 2.5–4.6)

## 2022-12-18 LAB — BASIC METABOLIC PANEL
Anion gap: 15 (ref 5–15)
BUN: 31 mg/dL — ABNORMAL HIGH (ref 8–23)
CO2: 23 mmol/L (ref 22–32)
Calcium: 8.4 mg/dL — ABNORMAL LOW (ref 8.9–10.3)
Chloride: 87 mmol/L — ABNORMAL LOW (ref 98–111)
Creatinine, Ser: 1.59 mg/dL — ABNORMAL HIGH (ref 0.44–1.00)
GFR, Estimated: 34 mL/min — ABNORMAL LOW (ref >60)
Glucose, Bld: 107 mg/dL — ABNORMAL HIGH (ref 70–99)
Potassium: 3.2 mmol/L — ABNORMAL LOW (ref 3.5–5.1)
Sodium: 125 mmol/L — ABNORMAL LOW (ref 135–145)

## 2022-12-18 LAB — MAGNESIUM: Magnesium: 1.7 mg/dL (ref 1.7–2.4)

## 2022-12-18 LAB — SODIUM
Sodium: 125 mmol/L — ABNORMAL LOW (ref 135–145)
Sodium: 126 mmol/L — ABNORMAL LOW (ref 135–145)

## 2022-12-18 MED ORDER — OXYCODONE HCL 5 MG PO TABS
5.0000 mg | ORAL_TABLET | ORAL | Status: DC | PRN
  Administered 2022-12-18: 5 mg via ORAL
  Filled 2022-12-18: qty 1

## 2022-12-18 MED ORDER — LACTATED RINGERS IV SOLN: INTRAVENOUS | Status: AC

## 2022-12-18 MED ORDER — OXYCODONE HCL 5 MG PO TABS
10.0000 mg | ORAL_TABLET | ORAL | Status: DC | PRN
  Administered 2022-12-18: 10 mg via ORAL
  Filled 2022-12-18: qty 2

## 2022-12-18 MED ORDER — POTASSIUM CHLORIDE 20 MEQ PO PACK
20.0000 meq | PACK | Freq: Once | ORAL | Status: AC
  Administered 2022-12-18: 20 meq via ORAL
  Filled 2022-12-18: qty 1

## 2022-12-18 MED ORDER — HEPARIN SODIUM (PORCINE) 5000 UNIT/ML IJ SOLN
5000.0000 [IU] | Freq: Three times a day (TID) | INTRAMUSCULAR | Status: DC
  Administered 2022-12-18 – 2022-12-19 (×4): 5000 [IU] via SUBCUTANEOUS
  Filled 2022-12-18 (×4): qty 1

## 2022-12-18 NOTE — Progress Notes (Signed)
NAME:  Kaitlin Little, MRN:  [id number], DOB:  1945/10/13, LOS: 1 ADMISSION DATE:  12/17/2022, CONSULTATION DATE: 12/17/2022 REFERRING MD: Dene Gentry, CHIEF COMPLAINT: Abdominal pain and GI bleeding  History of Present Illness:  77 year old female with peptic ulcer disease, hypertension and hyperlipidemia who was brought into the emergency department with a complaint of abdominal pain, nausea, vomiting and dark stool per rectum for last 3 days.  As per patient she was in her usual state of health about 3 days ago when she started with sudden onset of abdominal pain which progressively got worse associated with nausea and vomiting and for last 2 days she has been having initially dark stool per rectum then followed by bright red bleeding.  She was unable to eat or drink anything for last 3 days, started feeling generalized weak with increasing abdominal pain, decided to come to the emergency department.  She denies any sick contact, fever, chills, chest pain or palpitation  Pertinent  Medical History   Past Medical History:  Diagnosis Date   Anemia    bleeding ulcers no blood transfusions   Arthritis    Depression    GERD (gastroesophageal reflux disease)    Headache    sinus headaches   History of hiatal hernia    Hypertension    Hyperthyroidism    benign nodules in neck no meds     Significant Hospital Events: Including procedures, antibiotic start and stop dates in addition to other pertinent events   12/17/2022 admitted with dehydration, hyponatremia AKI mild hematochezia in the setting of viral gastroenteritis, CT scan reveals signs of the same 12/18/2022 asking for diet, no further BMs, feeling better, creatinine back  Interim History / Subjective:  Feeling a bit better.  Asked for diet.  Creatinine improving.  Trending hemoglobins.  No further Bms.  Objective   Blood pressure (!) 151/70, pulse 96, temperature 98.7 F (37.1 C), temperature source Oral, resp. rate 17, weight 57.4  kg, SpO2 94 %.        Intake/Output Summary (Last 24 hours) at 12/18/2022 1229 Last data filed at 12/18/2022 1100 Gross per 24 hour  Intake 3452.36 ml  Output 350 ml  Net 3102.36 ml    Filed Weights   12/18/22 0500  Weight: 57.4 kg    Examination: Physical exam: General: No acute distress, lying on the bed HEENT: [place]/AT, eyes anicteric. dry mucus membranes Neuro: Alert, awake following commands Chest: Clear, normal work of breathing, no wheezes or rhonchi Heart: Regular rate and rhythm, no murmurs or gallops Abdomen: Soft, diffuse slight tender, nondistended, bowel sounds present Skin: No rash  Labs and images were reviewed  Resolved Hospital Problem list     Assessment & Plan:  Acute gastroenteritis suspect viral in nature given improvement over the initial 12 to 24 hours of admission Severe dehydration --Discontinue antibiotics -- Fluids as below -- Awaiting collection of C. difficile and GI pathogen panel, no further BM since admission  Hyponatremia/hypochloremia: Due to poor oral intake, GI losses and HCTZ for hypertension as well as component of SIADH with elevated urine Na -- Sodium 116 3/17 afternoon to 125 3/18 morning -- Continue LR MIVF 75 cc/hour as she appears volume down on exam -- Continue sodium checks  High anion gap metabolic acidosis Acute kidney injury due to ischemic ATN in the setting of severe dehydration --Creatinine improving, continue maintenance IV fluids  Prior history of gastric ulcer Mild hematochezia: Suspect related to gastroenteritis --Continue IV Protonix twice daily -- Serial CBCs  to assess if dropping, initial hemoglobin high then low after fluids suspect dilutional, continue to trend  Hypertension --Hold HCTZ due to hyponatremia and AKI, hold ARB due to AKI -- Continue home amlodipine  Hyperlipidemia --Continue Zocor  Best Practice (right click and "Reselect all SmartList Selections" daily)   Diet/type: NPO DVT  prophylaxis: SCD GI prophylaxis: PPI Lines: N/A Foley:  N/A Code Status:  full code Last date of multidisciplinary goals of care discussion [3/17: Updated patient at bedside, decision was to continue full scope of care]  Labs   CBC: Recent Labs  Lab 12/17/22 1422 12/18/22 0600  WBC 22.8* 11.1*  HGB 15.1* 10.9*  HCT 43.5 29.6*  MCV 88.4 86.5  PLT 386 238     Basic Metabolic Panel: Recent Labs  Lab 12/17/22 1422 12/17/22 1707 12/17/22 2038 12/17/22 2310 12/18/22 0600  NA 116*  --  123* 123* 125*  K 3.8  --   --   --  3.2*  CL 77*  --   --   --  87*  CO2 15*  --   --   --  23  GLUCOSE 160*  --   --   --  107*  BUN 38*  --   --   --  31*  CREATININE 3.03*  --   --   --  1.59*  CALCIUM 9.7  --   --   --  8.4*  MG  --  1.8  --   --  1.7  PHOS  --  5.6*  --   --  4.2    GFR: CrCl cannot be calculated (Unknown ideal weight.). Recent Labs  Lab 12/17/22 1422 12/17/22 2038 12/18/22 0600  WBC 22.8*  --  11.1*  LATICACIDVEN  --  1.6  --      Liver Function Tests: Recent Labs  Lab 12/17/22 1422  AST 47*  ALT 20  ALKPHOS 75  BILITOT 0.4  PROT 7.2  ALBUMIN 4.5    Recent Labs  Lab 12/17/22 1422  LIPASE 48    No results for input(s): "AMMONIA" in the last 168 hours.  ABG No results found for: "PHART", "PCO2ART", "PO2ART", "HCO3", "TCO2", "ACIDBASEDEF", "O2SAT"   Coagulation Profile: No results for input(s): "INR", "PROTIME" in the last 168 hours.  Cardiac Enzymes: No results for input(s): "CKTOTAL", "CKMB", "CKMBINDEX", "TROPONINI" in the last 168 hours.  HbA1C: No results found for: "HGBA1C"  CBG: Recent Labs  Lab 12/17/22 1950  GLUCAP 114*    Review of Systems:   12 point review of systems significant for complaint mentioned HPI, rest is negative  Past Medical History:  She,  has a past medical history of Anemia, Arthritis, Depression, GERD (gastroesophageal reflux disease), Headache, History of hiatal hernia, Hypertension, and  Hyperthyroidism.   Surgical History:   Past Surgical History:  Procedure Laterality Date   ABDOMINAL HYSTERECTOMY     partial   BACK SURGERY     lower   BREAST SURGERY     lumpectomy bil breast   FOOT SURGERY     bil bunions and straightening great toe   JOINT REPLACEMENT     Left hip right hip 04/27/17 Dr. Loletha Grayer blackman   knee arthroscopy     left   TOTAL HIP ARTHROPLASTY Right 04/27/2017   Procedure: RIGHT TOTAL HIP ARTHROPLASTY ANTERIOR APPROACH;  Surgeon: Mcarthur Rossetti, MD;  Location: [facility];  Service: Orthopedics;  Laterality: Right;     Social History:   reports that she  has quit smoking. Her smoking use included cigarettes. She smoked an average of .5 packs per day. She has never used smokeless tobacco. She reports current alcohol use of about 1.0 standard drink of alcohol per week. She reports that she does not use drugs.   Family History:  Her family history is not on file.   Allergies Allergies  Allergen Reactions   Fish Allergy Anaphylaxis and Hives    Only shrimp   Adhesive [Tape] Other (See Comments)    Skin tears/redness. Cloth/paper tape tolerates   Ampicillin Hives and Diarrhea    TOLERATES ORALLY NOT INTRAVENOUS  Has patient had a PCN reaction causing immediate rash, facial/tongue/throat swelling, SOB or lightheadedness with hypotension:No Has patient had a PCN reaction causing severe rash involving mucus membranes or skin necrosis:Unknown Has patient had a PCN reaction that required hospitalization:Inpatient when reaction occurred. Has patient had a PCN reaction occurring within the last 10 years: No If all of the above answers are "NO", then may proceed with Cephalosporin   Latex Other (See Comments)    Skin redness/peels skin     Home Medications  Prior to Admission medications   Medication Sig Start Date End Date Taking? Authorizing Provider  acetaminophen (TYLENOL) 500 MG tablet Take 500 mg by mouth every 6 (six) hours as needed for  headache.    [provider]  amLODipine (NORVASC) 5 MG tablet Take 5 mg by mouth daily. 11/05/15   [provider]  Cholecalciferol (VITAMIN D) 2000 units CAPS Take 2,000 Units by mouth daily.    [provider]  DULoxetine (CYMBALTA) 30 MG capsule Take 30-60 mg by mouth 2 (two) times daily. 60 mg in the morning, then 30 mg at night. 05/16/21   [provider]  lisinopril-hydrochlorothiazide (ZESTORETIC) 10-12.5 MG tablet Take 1 tablet by mouth daily. 04/23/21   [provider]  methocarbamol (ROBAXIN) 500 MG tablet Take 1 tablet (500 mg total) by mouth every 6 (six) hours as needed for muscle spasms. 04/30/17   Mcarthur Rossetti, MD  MOVANTIK 25 MG TABS tablet Take 25 mg by mouth daily. 01/17/16   [provider]  ondansetron (ZOFRAN) 4 MG tablet Take 1 tablet (4 mg total) by mouth every 6 (six) hours as needed for nausea. 06/18/21   Shelly Coss, MD  oxyCODONE-acetaminophen (PERCOCET) 10-325 MG tablet Take 1 tablet by mouth every 6 (six) hours as needed for pain. 04/30/17   Mcarthur Rossetti, MD  pantoprazole (PROTONIX) 40 MG tablet Take 40 mg by mouth 2 (two) times daily. 06/11/21   [provider]  simvastatin (ZOCOR) 10 MG tablet Take 10 mg by mouth every evening.  12/11/15   [provider]  zolpidem (AMBIEN) 10 MG tablet Take 10 mg by mouth at bedtime as needed for sleep.    [provider]     Critical care time:      CRITICAL CARE Performed by: Lanier Clam   Total critical care time: 33 minutes  Critical care time was exclusive of separately billable procedures and treating other patients.  Critical care was necessary to treat or prevent imminent or life-threatening deterioration.  Critical care was time spent personally by me on the following activities: development of treatment plan with patient and/or surrogate as well as nursing, discussions with consultants, evaluation of patient's  response to treatment, examination of patient, obtaining history from patient or surrogate, ordering and performing treatments and interventions, ordering and review of laboratory studies, ordering and review of  radiographic studies, pulse oximetry and re-evaluation of patient's condition.   Lanier Clam, MD [facility] See Amion for pager If no response to pager, please call [phone] until 7pm After 7pm, Please call E-link [phone]

## 2022-12-19 DIAGNOSIS — N179 Acute kidney failure, unspecified: Secondary | ICD-10-CM | POA: Diagnosis not present

## 2022-12-19 DIAGNOSIS — E871 Hypo-osmolality and hyponatremia: Secondary | ICD-10-CM | POA: Diagnosis not present

## 2022-12-19 DIAGNOSIS — R197 Diarrhea, unspecified: Secondary | ICD-10-CM | POA: Diagnosis not present

## 2022-12-19 LAB — GASTROINTESTINAL PANEL BY PCR, STOOL (REPLACES STOOL CULTURE)

## 2022-12-19 LAB — MAGNESIUM: Magnesium: 1.9 mg/dL (ref 1.7–2.4)

## 2022-12-19 LAB — COMPREHENSIVE METABOLIC PANEL
ALT: 22 U/L (ref 0–44)
AST: 32 U/L (ref 15–41)
Albumin: 2.9 g/dL — ABNORMAL LOW (ref 3.5–5.0)
Alkaline Phosphatase: 53 U/L (ref 38–126)
Anion gap: 11 (ref 5–15)
BUN: 15 mg/dL (ref 8–23)
CO2: 26 mmol/L (ref 22–32)
Calcium: 8.7 mg/dL — ABNORMAL LOW (ref 8.9–10.3)
Chloride: 90 mmol/L — ABNORMAL LOW (ref 98–111)
Creatinine, Ser: 0.87 mg/dL (ref 0.44–1.00)
GFR, Estimated: >60 mL/min (ref >60)
Glucose, Bld: 88 mg/dL (ref 70–99)
Potassium: 3.1 mmol/L — ABNORMAL LOW (ref 3.5–5.1)
Sodium: 127 mmol/L — ABNORMAL LOW (ref 135–145)
Total Bilirubin: 0.3 mg/dL (ref 0.3–1.2)
Total Protein: 5.4 g/dL — ABNORMAL LOW (ref 6.5–8.1)

## 2022-12-19 LAB — CBC
HCT: 28.9 % — ABNORMAL LOW (ref 36.0–46.0)
Hemoglobin: 9.9 g/dL — ABNORMAL LOW (ref 12.0–15.0)
MCH: 30.9 pg (ref 26.0–34.0)
MCHC: 34.3 g/dL (ref 30.0–36.0)
MCV: 90.3 fL (ref 80.0–100.0)
Platelets: 218 10*3/uL (ref 150–400)
RBC: 3.2 MIL/uL — ABNORMAL LOW (ref 3.87–5.11)
RDW: 14.6 % (ref 11.5–15.5)
WBC: 11.2 10*3/uL — ABNORMAL HIGH (ref 4.0–10.5)
nRBC: 0 % (ref 0.0–0.2)

## 2022-12-19 LAB — SODIUM
Sodium: 122 mmol/L — ABNORMAL LOW (ref 135–145)
Sodium: 127 mmol/L — ABNORMAL LOW (ref 135–145)

## 2022-12-19 MED ORDER — POTASSIUM CHLORIDE CRYS ER 20 MEQ PO TBCR
40.0000 meq | EXTENDED_RELEASE_TABLET | Freq: Two times a day (BID) | ORAL | Status: AC
  Administered 2022-12-19 – 2022-12-20 (×3): 40 meq via ORAL
  Filled 2022-12-19 (×3): qty 2

## 2022-12-19 MED ORDER — HYDROMORPHONE HCL 1 MG/ML IJ SOLN
1.0000 mg | INTRAMUSCULAR | Status: DC | PRN
  Administered 2022-12-19 – 2022-12-21 (×7): 1 mg via INTRAVENOUS
  Filled 2022-12-19 (×8): qty 1

## 2022-12-19 MED ORDER — LOPERAMIDE HCL 2 MG PO CAPS
2.0000 mg | ORAL_CAPSULE | Freq: Four times a day (QID) | ORAL | Status: AC
  Administered 2022-12-19 – 2022-12-20 (×3): 2 mg via ORAL
  Filled 2022-12-19 (×4): qty 1

## 2022-12-19 MED ORDER — OXYCODONE HCL 5 MG PO TABS
10.0000 mg | ORAL_TABLET | ORAL | Status: DC | PRN
  Administered 2022-12-19 – 2022-12-23 (×14): 10 mg via ORAL
  Filled 2022-12-19 (×14): qty 2

## 2022-12-19 MED ORDER — RISAQUAD PO CAPS
1.0000 | ORAL_CAPSULE | Freq: Every day | ORAL | Status: DC
  Administered 2022-12-19 – 2022-12-23 (×5): 1 via ORAL
  Filled 2022-12-19 (×5): qty 1

## 2022-12-19 MED ORDER — METHYLNALTREXONE BROMIDE 150 MG PO TABS: 150.0000 mg | ORAL_TABLET | Freq: Every morning | ORAL | Status: DC

## 2022-12-19 MED ORDER — ACETAMINOPHEN 325 MG PO TABS
650.0000 mg | ORAL_TABLET | Freq: Four times a day (QID) | ORAL | Status: DC | PRN
  Administered 2022-12-19: 650 mg via ORAL
  Filled 2022-12-19: qty 2

## 2022-12-19 MED ORDER — DULOXETINE HCL 30 MG PO CPEP
30.0000 mg | ORAL_CAPSULE | Freq: Two times a day (BID) | ORAL | Status: DC
  Administered 2022-12-19 – 2022-12-20 (×3): 30 mg via ORAL
  Filled 2022-12-19 (×3): qty 1

## 2022-12-19 MED ORDER — PANTOPRAZOLE SODIUM 40 MG PO TBEC
40.0000 mg | DELAYED_RELEASE_TABLET | Freq: Every day | ORAL | Status: DC
  Administered 2022-12-19 – 2022-12-22 (×4): 40 mg via ORAL
  Filled 2022-12-19 (×4): qty 1

## 2022-12-19 NOTE — Progress Notes (Addendum)
Kaitlin Little  [id number] DOB: [birth year] DOA: 12/17/2022 PCP: Sandi Mariscal, MD    Brief Narrative:  77 year old with a history of PUD, HTN, and HLD who was brought to the ER with generalized abdominal pain nausea vomiting and dark stools for 3 days.  At presentation she was found to be suffering with acute kidney injury, hyponatremia, and severe dehydration.  She was diagnosed with a viral gastroenteritis.  Consultants:  PCCM  Goals of Care:  Code Status: Full Code   DVT prophylaxis: Change to SCDs due to hematochezia   Interim Hx: No acute events recorded overnight.  Vital signs stable.  The patient is afebrile.  Watery diarrhea persists, though it may have decreased mildly in his frequency.  It is limiting her ability to eat as eating seems to stimulate further episodes of diarrhea.  She denies chest pain shortness of breath fevers or chills.  Assessment & Plan:  Acute viral gastroenteritis No clinical sx to suggest a bacterial source - GI viral panel pending -short trial of Imodium -continue to hydrate until oral intake improved  Hyponatremia with severe dehydration Due to a combination of GI loss as well as use of HCTZ - sodium improved with simple volume expansion -continue to follow  Acute kidney injury due to ischemic ATN in setting of severe dehydration This has improved with volume resuscitation -continue same and recheck in a.m.  Recent Labs  Lab 12/17/22 1422 12/18/22 0600 12/19/22 0922  CREATININE 3.03* 1.59* 0.87    Hypokalemia Due to decreased intake, use of diuretic, and increased GI loss -supplement and follow -magnesium is normal  Mild hematochezia - history of PUD Hemoglobin has been stable -felt to simply be related to profuse watery diarrhea -no indication for acute endoscopy presently -patient reports ongoing bright red blood in her watery stool therefore we will recheck hemoglobin in a.m.  HTN Blood pressure presently controlled  HLD Continue  Zocor  Chronic pain syndrome/arthritis   Family Communication: No family present at time of exam Disposition: From home -anticipate return home once able to tolerate oral intake   Objective: Blood pressure 110/61, pulse 92, temperature 98.9 F (37.2 C), temperature source Oral, resp. rate 18, height 5\' 4"  (1.626 m), weight 56.9 kg, SpO2 98 %.  Intake/Output Summary (Last 24 hours) at 12/19/2022 1549 Last data filed at 12/19/2022 0900 Gross per 24 hour  Intake 554.95 ml  Output 900 ml  Net -345.05 ml   Filed Weights   12/18/22 0500 12/18/22 2100  Weight: 57.4 kg 56.9 kg    Examination: General: No acute respiratory distress Lungs: Clear to auscultation bilaterally without wheezes or crackles Cardiovascular: Regular rate and rhythm without murmur gallop or rub normal S1 and S2 Abdomen: Mildly tender diffusely but not distended, soft, bowel sounds positive, no rebound, no ascites, no appreciable mass Extremities: No significant cyanosis, clubbing, or edema bilateral lower extremities  CBC: Recent Labs  Lab 12/18/22 0600 12/18/22 1219 12/19/22 0922  WBC 11.1* 10.0 11.2*  HGB 10.9* 10.7* 9.9*  HCT 29.6* 29.6* 28.9*  MCV 86.5 86.3 90.3  PLT 238 243 [id number]   Basic Metabolic Panel: Recent Labs  Lab 12/17/22 1422 12/17/22 1707 12/17/22 2038 12/18/22 0600 12/18/22 1219 12/19/22 0103 12/19/22 0641 12/19/22 0922  NA 116*  --    < > 125*   < > 122* 127* 127*  K 3.8  --   --  3.2*  --   --   --  3.1*  CL 77*  --   --  87*  --   --   --  90*  CO2 15*  --   --  23  --   --   --  26  GLUCOSE 160*  --   --  107*  --   --   --  88  BUN 38*  --   --  31*  --   --   --  15  CREATININE 3.03*  --   --  1.59*  --   --   --  0.87  CALCIUM 9.7  --   --  8.4*  --   --   --  8.7*  MG  --  1.8  --  1.7  --   --   --  1.9  PHOS  --  5.6*  --  4.2  --   --   --   --    < > = values in this interval not displayed.   GFR: Estimated Creatinine Clearance: 48.2 mL/min (by C-G formula based  on SCr of 0.87 mg/dL).   Scheduled Meds:  acidophilus  1 capsule Oral Daily   amLODipine  10 mg Oral Daily   Chlorhexidine Gluconate Cloth  6 each Topical Q0600   DULoxetine  30 mg Oral BID   heparin injection (subcutaneous)  5,000 Units Subcutaneous Q8H   loperamide  2 mg Oral QID   Methylnaltrexone Bromide  150 mg Oral q morning   pantoprazole  40 mg Oral Daily   potassium chloride  40 mEq Oral BID   simvastatin  10 mg Oral q1800      LOS: 2 days   Cherene Altes, MD [facility] Office  [phone] Pager - Text Page per Shea Evans  If 7PM-7AM, please contact night-coverage per Amion 12/19/2022, 3:49 PM

## 2022-12-19 NOTE — Evaluation (Signed)
Physical Therapy Evaluation Patient Details Name: Kaitlin Little MRN: [id number] DOB: 01-Sep-1946 Today's Date: 12/19/2022  History of Present Illness  Patient is a 77 yo female admitted 12/17/22 due to N&V, abdominal pain, dark stools.  PMH positive for HTN, PUD, hyperlipidemia, GERD, HA, depression and arthritis.  Clinical Impression  Patient presents with generalized weakness after illness and feels she is getting better.  Reports walking to bathroom unaided and mobilizing to [facility] at night.  Still with abdominal pain limiting to in room ambulation only today.  Previously living with spouse in ground floor apartment and able to complete ADL/IADL's unaided.  Spouse can assist with showering at d/c.  Feel she may benefit from [facility] initially if still stooling a lot at night.  PT will not follow, but will ask mobility specialist to assist with progressing hallway ambulation.  No further skilled PT at this time.  PT will sign off.      Recommendations for follow up therapy are one component of a multi-disciplinary discharge planning process, led by the attending physician.  Recommendations may be updated based on patient status, additional functional criteria and insurance authorization.  Follow Up Recommendations No PT follow up      Assistance Recommended at Discharge PRN  Patient can return home with the following  Assistance with cooking/housework;Assist for transportation    Equipment Recommendations BSC/3in1  Recommendations for Other Services       Functional Status Assessment Patient has had a recent decline in their functional status and demonstrates the ability to make significant improvements in function in a reasonable and predictable amount of time.     Precautions / Restrictions Precautions Precautions: None      Mobility  Bed Mobility Overal bed mobility: Modified Independent                  Transfers Overall transfer level: Modified independent                       Ambulation/Gait Ambulation/Gait assistance: Independent Gait Distance (Feet): 60 Feet Assistive device: None Gait Pattern/deviations: Step-through pattern, Decreased stride length       General Gait Details: stayed in room as pt still with abominal pain; reports walking to bathroom on her own  Stairs            Wheelchair Mobility    Modified Rankin (Stroke Patients Only)       Balance Overall balance assessment: No apparent balance deficits (not formally assessed)                                           Pertinent Vitals/Pain Pain Assessment Pain Assessment: 0-10 Pain Score: 7  Pain Location: abdomen Pain Descriptors / Indicators: Discomfort, Aching Pain Intervention(s): Monitored during session, Repositioned, Limited activity within patient's tolerance    Home Living Family/patient expects to be discharged to:: Private residence Living Arrangements: Spouse/significant other Available Help at Discharge: Family Type of Home: Apartment Home Access: Level entry       Home Layout: One level Home Equipment: Cane - single point;Grab bars - tub/shower      Prior Function Prior Level of Function : Independent/Modified Independent                     Hand Dominance        Extremity/Trunk Assessment   Upper Extremity Assessment  Upper Extremity Assessment: Overall WFL for tasks assessed    Lower Extremity Assessment Lower Extremity Assessment: Overall WFL for tasks assessed       Communication   Communication: No difficulties  Cognition Arousal/Alertness: Awake/alert Behavior During Therapy: WFL for tasks assessed/performed Overall Cognitive Status: Within Functional Limits for tasks assessed                                          General Comments General comments (skin integrity, edema, etc.): NT reports pt walking unaided to bathroom; educated on gradual progression of activity and spouse  to supervise when showering initially and for pt to discuss with primary care after d/c if not progressing with strength, endurance and mobility.    Exercises     Assessment/Plan    PT Assessment Patient does not need any further PT services  PT Problem List         PT Treatment Interventions      PT Goals (Current goals can be found in the Care Plan section)  Acute Rehab PT Goals PT Goal Formulation: All assessment and education complete, DC therapy    Frequency       Co-evaluation               AM-PAC PT "6 Clicks" Mobility  Outcome Measure Help needed turning from your back to your side while in a flat bed without using bedrails?: None Help needed moving from lying on your back to sitting on the side of a flat bed without using bedrails?: None Help needed moving to and from a bed to a chair (including a wheelchair)?: None Help needed standing up from a chair using your arms (e.g., wheelchair or bedside chair)?: None Help needed to walk in hospital room?: None Help needed climbing 3-5 steps with a railing? : Total 6 Click Score: 21    End of Session   Activity Tolerance: Patient tolerated treatment well Patient left: in bed;with call bell/phone within reach   PT Visit Diagnosis: Muscle weakness (generalized) (M62.81)    Time: [id number] PT Time Calculation (min) (ACUTE ONLY): 15 min   Charges:   PT Evaluation $PT Eval Low Complexity: 1 Low          Magda Kiel, PT Acute Rehabilitation Services Office:[phone] 12/19/2022   Reginia Naas 12/19/2022, 3:30 PM

## 2022-12-19 NOTE — Progress Notes (Addendum)
  Transition of Care [facility]) Screening Note   Patient Details  Name: Kaitlin Little Date of Birth: [birth year]   Transition of Care [facility]) CM/SW Contact:    Tom-Johnson, Renea Ee, RN Phone Number: 12/19/2022, 3:27 PM  Patient is admitted for admitted with Dehydration, Hyponatremia, AKI, mild Hematochezia in the setting of Viral Gastroenteritis. Hgb on admit was 15.1, today hgb at 9.9. Na+ on admit was 116 and today Na+ is 127. Creatinine improving from 3.03 on admit to 0.87 today. No PT f/u noted. CM will continue to follow as patient progresses with care towards discharge.

## 2022-12-20 ENCOUNTER — Encounter (HOSPITAL_COMMUNITY): Payer: Self-pay | Admitting: Internal Medicine

## 2022-12-20 DIAGNOSIS — N179 Acute kidney failure, unspecified: Secondary | ICD-10-CM | POA: Diagnosis not present

## 2022-12-20 DIAGNOSIS — E871 Hypo-osmolality and hyponatremia: Secondary | ICD-10-CM | POA: Diagnosis not present

## 2022-12-20 LAB — CBC
HCT: 28.9 % — ABNORMAL LOW (ref 36.0–46.0)
Hemoglobin: 9.7 g/dL — ABNORMAL LOW (ref 12.0–15.0)
MCH: 30.8 pg (ref 26.0–34.0)
MCHC: 33.6 g/dL (ref 30.0–36.0)
MCV: 91.7 fL (ref 80.0–100.0)
Platelets: 237 10*3/uL (ref 150–400)
RBC: 3.15 MIL/uL — ABNORMAL LOW (ref 3.87–5.11)
RDW: 14.6 % (ref 11.5–15.5)
WBC: 13.2 10*3/uL — ABNORMAL HIGH (ref 4.0–10.5)
nRBC: 0 % (ref 0.0–0.2)

## 2022-12-20 LAB — BASIC METABOLIC PANEL
Anion gap: 7 (ref 5–15)
BUN: 5 mg/dL — ABNORMAL LOW (ref 8–23)
CO2: 29 mmol/L (ref 22–32)
Calcium: 8.5 mg/dL — ABNORMAL LOW (ref 8.9–10.3)
Chloride: 92 mmol/L — ABNORMAL LOW (ref 98–111)
Creatinine, Ser: 0.77 mg/dL (ref 0.44–1.00)
GFR, Estimated: >60 mL/min (ref >60)
Glucose, Bld: 110 mg/dL — ABNORMAL HIGH (ref 70–99)
Potassium: 3.8 mmol/L (ref 3.5–5.1)
Sodium: 128 mmol/L — ABNORMAL LOW (ref 135–145)

## 2022-12-20 MED ORDER — ENSURE ENLIVE PO LIQD
237.0000 mL | Freq: Two times a day (BID) | ORAL | Status: DC
  Administered 2022-12-20: 237 mL via ORAL

## 2022-12-20 MED ORDER — METHYLPREDNISOLONE 4 MG PO TABS
4.0000 mg | ORAL_TABLET | Freq: Two times a day (BID) | ORAL | Status: DC
  Administered 2022-12-20 – 2022-12-21 (×3): 4 mg via ORAL
  Filled 2022-12-20 (×5): qty 1

## 2022-12-20 MED ORDER — METRONIDAZOLE 500 MG PO TABS
500.0000 mg | ORAL_TABLET | Freq: Two times a day (BID) | ORAL | Status: DC
  Administered 2022-12-20 – 2022-12-22 (×4): 500 mg via ORAL
  Filled 2022-12-20 (×4): qty 1

## 2022-12-20 MED ORDER — CIPROFLOXACIN HCL 500 MG PO TABS
500.0000 mg | ORAL_TABLET | Freq: Two times a day (BID) | ORAL | Status: DC
  Administered 2022-12-20 – 2022-12-22 (×4): 500 mg via ORAL
  Filled 2022-12-20 (×5): qty 1

## 2022-12-20 NOTE — Progress Notes (Signed)
  Progress Note   Patient: Kaitlin Little [id number] DOB: 11-Dec-1945 DOA: 12/17/2022     3 DOS: the patient was seen and examined on 12/20/2022   Brief hospital course: 77 year old with a history of PUD, HTN, and HLD who was brought to the ER with generalized abdominal pain nausea vomiting and dark stools for 3 days. At presentation she was found to be suffering with acute kidney injury, hyponatremia, and severe dehydration. She was diagnosed with a viral gastroenteritis   Assessment and Plan: Acute gastroenteritis, ileitis GI viral panel unremarkable -Pt was given short trial of Imodium with improvement in symptoms -Presenting CT with findings compatible with infectious/inflammatory colitis and ileitis.  -Patient continues to have notable abd discomfort requiring narcotics -no improvement with trial of steroid -Is tolerating PO -WBC is trending up. Have ordered cipro and flagyl. Pt has documented PCN allergy   Hyponatremia with severe dehydration Due to a combination of GI loss as well as use of HCTZ - sodium improved with simple volume expansion -recheck bmet in AM   Acute kidney injury due to ischemic ATN in setting of severe dehydration This has improved with volume resuscitation  -recheck bmet in AM    Hypokalemia Due to decreased intake, use of diuretic, and increased GI loss -supplement and follow -magnesium is normal   Mild hematochezia - history of PUD Hemoglobin has been stable -felt to simply be related to profuse watery diarrhea -no indication for acute endoscopy presently  -Hgb stable -Recheck cbc in AM   HTN Blood pressure presently controlled   HLD Continue Zocor   Chronic pain syndrome/arthritis      Subjective: Still complaining of lower abd pain, worse with movement and sitting on toilet  Physical Exam: Vitals:   12/19/22 2239 12/20/22 0620 12/20/22 0938 12/20/22 1546  BP: (!) 127/54 (!) 141/98 138/62 139/76  Pulse: 92 94 90 93  Resp: 18 18 17 18    Temp: 98.3 F (36.8 C) 98.7 F (37.1 C) 98.2 F (36.8 C) 97.9 F (36.6 C)  TempSrc: Oral Oral  Oral  SpO2: 95% 96% 98% 92%  Weight:      Height:       General exam: Awake, laying in bed, in nad Respiratory system: Normal respiratory effort, no wheezing Cardiovascular system: regular rate, s1, s2 Gastrointestinal system: Soft, nondistended, LLQ abd pain Central nervous system: CN2-12 grossly intact, strength intact Extremities: Perfused, no clubbing Skin: Normal skin turgor, no notable skin lesions seen Psychiatry: Mood normal // no visual hallucinations   Data Reviewed:  Na 128, K 3.8, Cr 0.77, WBC 13.2, Hgb 9.7  Family Communication: Pt in room, family not at bedside  Disposition: Status is: Inpatient Remains inpatient appropriate because: Severity of illness  Planned Discharge Destination: Home     Author: Marylu Lund, MD 12/20/2022 4:42 PM  For on call review [email or web address].

## 2022-12-20 NOTE — Hospital Course (Signed)
77 year old with a history of PUD, HTN, and HLD who was brought to the ER with generalized abdominal pain nausea vomiting and dark stools for 3 days. At presentation she was found to be suffering with acute kidney injury, hyponatremia, and severe dehydration. She was diagnosed with a viral gastroenteritis

## 2022-12-20 NOTE — Plan of Care (Signed)
  Problem: Education: Goal: Knowledge of General Education information will improve Description: Including pain rating scale, medication(s)/side effects and non-pharmacologic comfort measures Outcome: Completed/Met   

## 2022-12-20 NOTE — Progress Notes (Signed)
Mobility Specialist Progress Note   12/20/22 1028  Mobility  Activity Ambulated independently in hallway  Level of Assistance Modified independent, requires aide device or extra time  Assistive Device Front wheel walker  Distance Ambulated (ft) 420 ft  Activity Response Tolerated well  Mobility Referral Yes  $Mobility charge 1 Mobility   Pt received in bed having c/o abdominal and back pain (5/10) but agreeable to mobility. Pt requesting RW for comfort during ambulation. Able to ambulate in hallways w/o physical assistance. No faults during session returned back to bed w/o fault or further complaint. Left in bed w/ all needs met.  Holland Falling Mobility Specialist Please contact via SecureChat or  Rehab office at [phone]

## 2022-12-20 NOTE — Plan of Care (Signed)

## 2022-12-20 NOTE — Progress Notes (Signed)
Initial Nutrition Assessment  DOCUMENTATION CODES:   Not applicable  INTERVENTION:  - Add Ensure Enlive po BID, each supplement provides 350 kcal and 20 grams of protein.  NUTRITION DIAGNOSIS:   Inadequate oral intake related to nausea, vomiting as evidenced by per patient/family report.  GOAL:   Patient will meet greater than or equal to 90% of their needs  MONITOR:   PO intake, Supplement acceptance  REASON FOR ASSESSMENT:   Malnutrition Screening Tool    ASSESSMENT:   77 y.o. female admits related to abdominal pain, nausea, vomiting, and dark stool per rectum for last 3 days. PMH includes: anemia, arthritis, depression, GERD, headache, HTN, hypothyroidism. Pt is currently receiving medical management related to acute viral gastroenteritis.  Meds reviewed: risaquad, imodium. Labs reviewed: Na low, K low.   The pt reports that she had not been eating well for about 4 days PTA due to nausea and vomiting. Pt denies any wt loss. She states that she was eating well PTA. Pt reports that she would like to trial Ensure while she is here. RD will add supplements and continue to monitor PO intakes.   NUTRITION - FOCUSED PHYSICAL EXAM:  Flowsheet Row Most Recent Value  Orbital Region No depletion  Upper Arm Region No depletion  Thoracic and Lumbar Region Unable to assess  Buccal Region No depletion  Temple Region Mild depletion  Clavicle Bone Region Mild depletion  Clavicle and Acromion Bone Region Mild depletion  Scapular Bone Region No depletion  Dorsal Hand Mild depletion  Patellar Region No depletion  Anterior Thigh Region No depletion  Posterior Calf Region No depletion  Edema (RD Assessment) None  Hair Reviewed  Eyes Reviewed  Mouth Reviewed  Skin Reviewed  Nails Reviewed       Diet Order:   Diet Order             Diet regular Room service appropriate? Yes; Fluid consistency: Thin  Diet effective now                   EDUCATION NEEDS:   Not  appropriate for education at this time  Skin:  Skin Assessment: Reviewed RN Assessment  Last BM:  3/19 - type 7  Height:   Ht Readings from Last 1 Encounters:  12/18/22 5\' 4"  (1.626 m)    Weight:   Wt Readings from Last 1 Encounters:  12/19/22 54.9 kg    Ideal Body Weight:     BMI:  Body mass index is 20.78 kg/m.  Estimated Nutritional Needs:   Kcal:  [id number] kcals  Protein:  70-85 gm  Fluid:  >/= 1.3 L  Thalia Bloodgood, RD, LDN, CNSC.

## 2022-12-20 NOTE — Care Management Important Message (Signed)
Important Message  Patient Details  Name: Kaitlin Little MRN: [id number] Date of Birth: 1946-09-13   Medicare Important Message Given:  Yes     Orbie Pyo 12/20/2022, 2:37 PM

## 2022-12-21 ENCOUNTER — Inpatient Hospital Stay (HOSPITAL_COMMUNITY): Payer: Medicare Other

## 2022-12-21 DIAGNOSIS — E871 Hypo-osmolality and hyponatremia: Secondary | ICD-10-CM | POA: Diagnosis not present

## 2022-12-21 DIAGNOSIS — N179 Acute kidney failure, unspecified: Secondary | ICD-10-CM | POA: Diagnosis not present

## 2022-12-21 LAB — CBC
HCT: 31.2 % — ABNORMAL LOW (ref 36.0–46.0)
Hemoglobin: 10.3 g/dL — ABNORMAL LOW (ref 12.0–15.0)
MCH: 30.6 pg (ref 26.0–34.0)
MCHC: 33 g/dL (ref 30.0–36.0)
MCV: 92.6 fL (ref 80.0–100.0)
Platelets: 291 10*3/uL (ref 150–400)
RBC: 3.37 MIL/uL — ABNORMAL LOW (ref 3.87–5.11)
RDW: 14.6 % (ref 11.5–15.5)
WBC: 15.3 10*3/uL — ABNORMAL HIGH (ref 4.0–10.5)
nRBC: 0 % (ref 0.0–0.2)

## 2022-12-21 LAB — COMPREHENSIVE METABOLIC PANEL
ALT: 23 U/L (ref 0–44)
AST: 21 U/L (ref 15–41)
Albumin: 3.1 g/dL — ABNORMAL LOW (ref 3.5–5.0)
Alkaline Phosphatase: 69 U/L (ref 38–126)
Anion gap: 7 (ref 5–15)
BUN: 5 mg/dL — ABNORMAL LOW (ref 8–23)
CO2: 31 mmol/L (ref 22–32)
Calcium: 8.9 mg/dL (ref 8.9–10.3)
Chloride: 91 mmol/L — ABNORMAL LOW (ref 98–111)
Creatinine, Ser: 0.72 mg/dL (ref 0.44–1.00)
GFR, Estimated: >60 mL/min (ref >60)
Glucose, Bld: 131 mg/dL — ABNORMAL HIGH (ref 70–99)
Potassium: 4.4 mmol/L (ref 3.5–5.1)
Sodium: 129 mmol/L — ABNORMAL LOW (ref 135–145)
Total Bilirubin: 0.3 mg/dL (ref 0.3–1.2)
Total Protein: 5.6 g/dL — ABNORMAL LOW (ref 6.5–8.1)

## 2022-12-21 MED ORDER — ORAL CARE MOUTH RINSE: 15.0000 mL | OROMUCOSAL | Status: DC | PRN

## 2022-12-21 NOTE — Plan of Care (Signed)
  Problem: Clinical Measurements: Goal: Will remain free from infection Outcome: Progressing   Problem: Pain Managment: Goal: General experience of comfort will improve Outcome: Progressing   

## 2022-12-21 NOTE — Progress Notes (Signed)
Mobility Specialist Progress Note   12/21/22 1732  Mobility  Activity Ambulated with assistance in hallway  Level of Assistance Standby assist, set-up cues, supervision of patient - no hands on  Assistive Device Front wheel walker  Distance Ambulated (ft) 470 ft  Activity Response Tolerated well  Mobility Referral Yes  $Mobility charge 1 Mobility   Received in bed c/o distended abdomen but agreeable to mobility. Ambulated independently in hallway w/ a steady gait. Returned back to bed w/o fault and all needs met.  Holland Falling Mobility Specialist Please contact via SecureChat or  Rehab office at [phone]

## 2022-12-21 NOTE — Progress Notes (Signed)
  Progress Note   Patient: Kaitlin Little DOB: [birth year] DOA: 12/17/2022     4 DOS: the patient was seen and examined on 12/21/2022   Brief hospital course: 77 year old with a history of PUD, HTN, and HLD who was brought to the ER with generalized abdominal pain nausea vomiting and dark stools for 3 days. At presentation she was found to be suffering with acute kidney injury, hyponatremia, and severe dehydration. She was diagnosed with a viral gastroenteritis   Assessment and Plan: Acute gastroenteritis, ileitis GI viral panel unremarkable -Pt was given short trial of Imodium with improvement in symptoms -Presenting CT with findings compatible with infectious/inflammatory colitis and ileitis.  -Overnight, complaining of continued abd pain requiring more narcotic -This AM, decreased BS on exam. Pt reports no flatus -Ordered and reviewed KUB. Evidence of ileus vs obstruction -Have ordered f/u repeat CT abd -Cont with cipro/flagyl   Hyponatremia with severe dehydration Due to a combination of GI loss as well as use of HCTZ - sodium improved with simple volume expansion -recheck bmet in AM   Acute kidney injury due to ischemic ATN in setting of severe dehydration This has improved with volume resuscitation  -recheck bmet in AM    Hypokalemia Due to decreased intake, use of diuretic, and increased GI loss -supplement and follow -magnesium is normal   Mild hematochezia - history of PUD Hemoglobin has been stable -felt to simply be related to profuse watery diarrhea -no indication for acute endoscopy presently  -Hgb stable -Recheck cbc in AM   HTN Blood pressure presently controlled   HLD Continue Zocor   Chronic pain syndrome/arthritis      Subjective: Overnight, pt needing more narcotic for abd pain. Currently eager to go home, however is not passing flatus and pt reports abd seems more distended  Physical Exam: Vitals:   12/21/22 0505 12/21/22 0700 12/21/22 0715  12/21/22 1400  BP: (!) 150/56  (!) 157/80 (!) 163/69  Pulse: 82  86 89  Resp: 18  18 18   Temp: 98.4 F (36.9 C)  98.2 F (36.8 C) 98.6 F (37 C)  TempSrc: Oral Oral  Oral  SpO2: 96%  97% 99%  Weight:      Height:       General exam: Conversant, in no acute distress Respiratory system: normal chest rise, clear, no audible wheezing Cardiovascular system: regular rhythm, s1-s2 Gastrointestinal system: Nondistended, nontender, pos BS Central nervous system: No seizures, no tremors Extremities: No cyanosis, no joint deformities Skin: No rashes, no pallor Psychiatry: Affect normal // no auditory hallucinations   Data Reviewed:  Na 129, K 4.4, Cr 0.72, WBC 15.3, Hgb 10.3  Family Communication: Pt in room, family not at bedside  Disposition: Status is: Inpatient Remains inpatient appropriate because: Severity of illness  Planned Discharge Destination: Home     Author: Marylu Lund, MD 12/21/2022 3:13 PM  For on call review [email or web address].

## 2022-12-22 ENCOUNTER — Inpatient Hospital Stay (HOSPITAL_COMMUNITY): Payer: Medicare Other

## 2022-12-22 DIAGNOSIS — R140 Abdominal distension (gaseous): Secondary | ICD-10-CM | POA: Diagnosis not present

## 2022-12-22 DIAGNOSIS — E871 Hypo-osmolality and hyponatremia: Secondary | ICD-10-CM | POA: Diagnosis not present

## 2022-12-22 DIAGNOSIS — R1084 Generalized abdominal pain: Secondary | ICD-10-CM | POA: Diagnosis not present

## 2022-12-22 DIAGNOSIS — R112 Nausea with vomiting, unspecified: Secondary | ICD-10-CM | POA: Diagnosis not present

## 2022-12-22 DIAGNOSIS — K567 Ileus, unspecified: Secondary | ICD-10-CM | POA: Diagnosis not present

## 2022-12-22 DIAGNOSIS — N179 Acute kidney failure, unspecified: Secondary | ICD-10-CM | POA: Diagnosis not present

## 2022-12-22 LAB — COMPREHENSIVE METABOLIC PANEL
ALT: 21 U/L (ref 0–44)
AST: 19 U/L (ref 15–41)
Albumin: 2.9 g/dL — ABNORMAL LOW (ref 3.5–5.0)
Alkaline Phosphatase: 70 U/L (ref 38–126)
Anion gap: 7 (ref 5–15)
BUN: 7 mg/dL — ABNORMAL LOW (ref 8–23)
CO2: 28 mmol/L (ref 22–32)
Calcium: 8.6 mg/dL — ABNORMAL LOW (ref 8.9–10.3)
Chloride: 96 mmol/L — ABNORMAL LOW (ref 98–111)
Creatinine, Ser: 0.74 mg/dL (ref 0.44–1.00)
GFR, Estimated: >60 mL/min (ref >60)
Glucose, Bld: 101 mg/dL — ABNORMAL HIGH (ref 70–99)
Potassium: 3.5 mmol/L (ref 3.5–5.1)
Sodium: 131 mmol/L — ABNORMAL LOW (ref 135–145)
Total Bilirubin: 0.6 mg/dL (ref 0.3–1.2)
Total Protein: 5 g/dL — ABNORMAL LOW (ref 6.5–8.1)

## 2022-12-22 LAB — CBC
HCT: 31.9 % — ABNORMAL LOW (ref 36.0–46.0)
Hemoglobin: 10.5 g/dL — ABNORMAL LOW (ref 12.0–15.0)
MCH: 30.4 pg (ref 26.0–34.0)
MCHC: 32.9 g/dL (ref 30.0–36.0)
MCV: 92.5 fL (ref 80.0–100.0)
Platelets: 292 10*3/uL (ref 150–400)
RBC: 3.45 MIL/uL — ABNORMAL LOW (ref 3.87–5.11)
RDW: 15 % (ref 11.5–15.5)
WBC: 13.1 10*3/uL — ABNORMAL HIGH (ref 4.0–10.5)
nRBC: 0 % (ref 0.0–0.2)

## 2022-12-22 LAB — MAGNESIUM: Magnesium: 1.7 mg/dL (ref 1.7–2.4)

## 2022-12-22 MED ORDER — CIPROFLOXACIN IN D5W 200 MG/100ML IV SOLN
200.0000 mg | Freq: Two times a day (BID) | INTRAVENOUS | Status: DC
  Filled 2022-12-22: qty 100

## 2022-12-22 MED ORDER — IOHEXOL 9 MG/ML PO SOLN
500.0000 mL | ORAL | Status: AC
  Administered 2022-12-22 (×2): 500 mL via ORAL

## 2022-12-22 MED ORDER — SODIUM CHLORIDE 0.9 % IV SOLN: INTRAVENOUS | Status: DC | PRN

## 2022-12-22 MED ORDER — IOHEXOL 350 MG/ML SOLN
75.0000 mL | Freq: Once | INTRAVENOUS | Status: AC | PRN
  Administered 2022-12-22: 75 mL via INTRAVENOUS

## 2022-12-22 MED ORDER — PANTOPRAZOLE SODIUM 40 MG PO TBEC
40.0000 mg | DELAYED_RELEASE_TABLET | Freq: Two times a day (BID) | ORAL | Status: DC
  Administered 2022-12-22 – 2022-12-23 (×2): 40 mg via ORAL
  Filled 2022-12-22 (×2): qty 1

## 2022-12-22 MED ORDER — METRONIDAZOLE 500 MG/100ML IV SOLN
500.0000 mg | Freq: Three times a day (TID) | INTRAVENOUS | Status: DC
  Administered 2022-12-22 – 2022-12-23 (×3): 500 mg via INTRAVENOUS
  Filled 2022-12-22 (×4): qty 100

## 2022-12-22 MED ORDER — POLYETHYLENE GLYCOL 3350 17 G PO PACK
17.0000 g | PACK | Freq: Two times a day (BID) | ORAL | Status: DC
  Administered 2022-12-22 – 2022-12-23 (×2): 17 g via ORAL
  Filled 2022-12-22 (×2): qty 1

## 2022-12-22 MED ORDER — CIPROFLOXACIN IN D5W 400 MG/200ML IV SOLN
400.0000 mg | Freq: Two times a day (BID) | INTRAVENOUS | Status: DC
  Administered 2022-12-22 – 2022-12-23 (×2): 400 mg via INTRAVENOUS
  Filled 2022-12-22 (×3): qty 200

## 2022-12-22 NOTE — Progress Notes (Signed)
Patient ambulated in the hallway with a walker with NT on standby assist. She walked from her room to nurse's station towards [facility] then to end of hallway towards room 22 then back to her room approximately 370 feet. Patient tolerated it well.

## 2022-12-22 NOTE — Plan of Care (Signed)
?  Problem: Clinical Measurements: ?Goal: Will remain free from infection ?Outcome: Progressing ?  ?

## 2022-12-22 NOTE — Progress Notes (Signed)
  Progress Note   Patient: Kaitlin Little [id number] DOB: 1945-11-02 DOA: 12/17/2022     5 DOS: the patient was seen and examined on 12/22/2022   Brief hospital course: 77 year old with a history of PUD, HTN, and HLD who was brought to the ER with generalized abdominal pain nausea vomiting and dark stools for 3 days. At presentation she was found to be suffering with acute kidney injury, hyponatremia, and severe dehydration. She was diagnosed with a viral gastroenteritis   Assessment and Plan: Acute gastroenteritis, ileitis, ileus vs SBO GI viral panel unremarkable -Pt was given short trial of Imodium with resolution of diarrhea -Presenting CT with findings compatible with infectious/inflammatory colitis and ileitis.  -WBC had been slowly trending up and pt complained of continued LLQ pains, prompting empiric PO cipro and flagyl -Patient developed increasing abd distension and continued abd pain, prompting repeat CT with findings concerning for ileus vs SBO, tapering to the distal ileum with no definite transition zone -Disussed with General Surgery, who recommended discussing with GI -Pt still having abd discomfort and distension, complained of nausea with dry heaving this AM -Downgrade to clears, changed abx to IV cipro/flagyl. Encourage ambulation. Advised pt to limit narcotic use, although pt is very resistant to this suggestion   Hyponatremia with severe dehydration Due to a combination of GI loss as well as use of HCTZ - sodium improved with simple volume expansion -recheck bmet in AM   Acute kidney injury due to ischemic ATN in setting of severe dehydration This has improved with volume resuscitation  -recheck bmet in AM    Hypokalemia Due to decreased intake, use of diuretic, and increased GI loss -supplement and follow -magnesium is normal   Mild hematochezia - history of PUD Hemoglobin has been stable -felt to simply be related to profuse watery diarrhea -no indication for  acute endoscopy presently  -Hgb stable -Recheck cbc in AM   HTN Blood pressure presently controlled   HLD Continue Zocor   Chronic pain syndrome/arthritis      Subjective: Complaining of continued abd distension and pain. Nauseated this AM with some dry heaving  Physical Exam: Vitals:   12/21/22 1700 12/21/22 2202 12/22/22 0305 12/22/22 0803  BP: (!) 156/76 (!) 193/91  (!) 151/85  Pulse:  68 71 97  Resp:  17 18 16   Temp:  98.9 F (37.2 C) 98.8 F (37.1 C) 98.2 F (36.8 C)  TempSrc:  Oral Oral Oral  SpO2:  90% 96% 100%  Weight:      Height:       General exam: Awake, laying in bed, in nad Respiratory system: Normal respiratory effort, no wheezing Cardiovascular system: regular rate, s1, s2 Gastrointestinal system: Soft, nondistended, positive BS Central nervous system: CN2-12 grossly intact, strength intact Extremities: Perfused, no clubbing Skin: Normal skin turgor, no notable skin lesions seen Psychiatry: Mood normal // no visual hallucinations   Data Reviewed:  Na 131, K 3.5, Cr 0.74, WBC 13.1, Hgb 10.5  Family Communication: Pt in room, family not at bedside  Disposition: Status is: Inpatient Remains inpatient appropriate because: Severity of illness  Planned Discharge Destination: Home     Author: Marylu Lund, MD 12/22/2022 3:17 PM  For on call review [email or web address]

## 2022-12-22 NOTE — Consult Note (Addendum)
Consultation  Referring Provider:   [facility] Primary Care Physician:  Sandi Mariscal, MD Primary Gastroenterologist:  [facility]       Reason for Consultation:     Abdominal pain, possible ileus   Impression    Abdominal pain, nausea, vomiting  Negative GI pathogen panel Leukocytosis Has been on ceftriaxone and metronidazole With associated dark stools, possible ischemic colitis versus infectious  Possible ileus verus small bowel obstruction seen on CT Normal colonoscopy 2022 with [facility] per patient initial CT with colitis/enteritis treated for 3 days with ceftriaxone and metronidazole, continued pain and no flatus. Repeat CT 3 days later showed  increased moderate dilatation of fluid-filled small bowel loops, gradual tapering to nondilated distal ileum without evidence of discrete transition point. With recent normal colonoscopy, acute onset, no transition point and only abdominal surgery being hysterectomy, most likely this is possible infectious ileus made worse by chronic opioid use.  PUD Recent EGD with [facility] last week No further bleeding HGB stable  Chronic pain syndrome On 10 mg opioid outpatient TID for her back pain   Principal Problem:   GI bleed    LOS: 5 days     Plan   -Agree with continuing antibiotics -Continue to maintain magnesium above 2 and potassium at 4-4.5.  -Encourage early ambulation. -Minimize narcotics and anticholinergic medications -Seems to be tolerating liquids, can continue liquid diet. -However if patient develops worsening nausea and vomiting suggest NG tube placement for decompression - get daily portable KUB daily -Maintain adequate hydration with IVF maintenance - will do better bowel regimen with MiraLAX twice daily --Consider use of Methylnaltrexone as a one-time dose with patient's history of narcotic use -Any peritoneal signs, get stat CT and consult general surgery.  Thank you for your kind consultation, we will  continue to follow.         HPI:   Kaitlin Little is a 77 y.o. female with past medical history significant for hypertension, hyperlipidemia, history of peptic ulcer disease presented to the ER 12/17/2022 with sudden onset abdominal pain, nausea vomiting, dark stools followed by bright red bleeding.  Initially in the ER was hypertensive with tachycardia and tachypneic Initial admitting hemoglobin 15, sodium 116, serum creatinine 3 CT abdomen pelvis without contrast showed submucosal edema and mild adjacent stranding involving rectosigmoid colon, descending colon hepatic flexure, distal transverse colon there is air-fluid level in the cecum, multiple prominent loops fluid-filled distal ileum with wall thickening, distended through to the rectum without a transition point, decompressed proximal small bowel distention of the stomach.  Mild small bowel mesenteric edema.  Normal liver, normal gallbladder normal pancreas normal spleen. Patient treated conservatively with IV ceftriaxone and metronidazole, GI pathogen and stool culture negative. Continued pain medications with morphine and treated with Imodium. Overnight patient had worsening abdominal pain and decreased bowel sounds on exam and no flatulence. 3/22 KUB showed evidence of ileus versus obstruction. Repeat CT abdomen pelvis with contrast showed increased moderate dilatation of fluid-filled small bowel loops, gradual tapering to nondilated distal ileum without evidence of discrete transition point.  Could be due to ileus although partial small bowel obstruction cannot definitely be excluded.  No mass process or abnormal fluid collections.  No pathologically enlarged lymph nodes.  [name] is in the room and provides some of the history. Patient normally follows with Dr.Sihed at [facility]. Patient has history of IBS constipation/opioid-induced constipation.  She follows with pain management son oxycodone 10 mg 4 times daily. Patient tried  Movantik in the past but stopped working after 3 months, was given prescription of Relistor outpatient however has not tried it. She is only on fiber for her bowel movements.  Patient states she had recent colonoscopy 2022 had 2 small adenomatous polyps. Patient states she normally has bowel movement every third day.  She was in her normal state of health until she developed abdominal pain 12/17/2022 accompanied with diarrhea, nausea, vomiting. Patient she was having diarrhea 2-3 times a day, decreased p.o. intake. Developed dark stools then some blood in the stool.  Abdominal pain became a 10 out of 10 so she reported to the ER 12/19/2022. Had some diaphoresis, denies fevers or chills.  No sick contacts. Patient was giving Imodium during her hospital stay which improved her stool but then she developed worsening abdominal pain and stopped passing flatulence.  Has had some nausea only 1 episode of vomiting.  She is tolerating liquid diet at this time. Patient states she is on Nexium 40 mg twice daily, Carafate 3 times daily, had recent EGD.  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  Mild extrahepatic biliary duct level of ampulla main pancreatic duct is upper limit.  No cholelithiasis.  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 Stable ectasia of the biliary tree centrally with normal tapering towards the pancreatic head. No obvious filling defect.  Abnormal ED labs: Abnormal Labs Reviewed  COMPREHENSIVE METABOLIC PANEL - Abnormal; Notable for the following components:      Result Value   Sodium 116 (*)    Chloride 77 (*)    CO2 15 (*)     Glucose, Bld 160 (*)    BUN 38 (*)    Creatinine, Ser 3.03 (*)    AST 47 (*)    GFR, Estimated 16 (*)    Anion gap 24 (*)    All other components within normal limits  CBC - Abnormal; Notable for the following components:   WBC 22.8 (*)    Hemoglobin 15.1 (*)    All other components within normal limits  URINALYSIS, ROUTINE W REFLEX MICROSCOPIC - Abnormal; Notable for the following components:   APPearance HAZY (*)    Hgb urine dipstick SMALL (*)    Protein, ur 30 (*)    Leukocytes,Ua SMALL (*)    All other components within normal limits  OSMOLALITY - Abnormal; Notable for the following components:   Osmolality 261 (*)    All other components within normal limits  PHOSPHORUS - Abnormal; Notable for the following components:   Phosphorus 5.6 (*)    All other components within normal limits  CBC - Abnormal; Notable for the following components:   WBC 11.1 (*)    RBC 3.42 (*)    Hemoglobin 10.9 (*)    HCT 29.6 (*)    MCHC 36.8 (*)    All other components within normal limits  BASIC METABOLIC PANEL - Abnormal; Notable for the following components:   Sodium 125 (*)    Potassium 3.2 (*)    Chloride 87 (*)    Glucose, Bld 107 (*)    BUN 31 (*)    Creatinine, Ser 1.59 (*)  Calcium 8.4 (*)    GFR, Estimated 34 (*)    All other components within normal limits  SODIUM - Abnormal; Notable for the following components:   Sodium 123 (*)    All other components within normal limits  SODIUM - Abnormal; Notable for the following components:   Sodium 123 (*)    All other components within normal limits  GLUCOSE, CAPILLARY - Abnormal; Notable for the following components:   Glucose-Capillary 114 (*)    All other components within normal limits  SODIUM - Abnormal; Notable for the following components:   Sodium 125 (*)    All other components within normal limits  SODIUM - Abnormal; Notable for the following components:   Sodium 126 (*)    All other components within normal limits   SODIUM - Abnormal; Notable for the following components:   Sodium 122 (*)    All other components within normal limits  CBC - Abnormal; Notable for the following components:   RBC 3.43 (*)    Hemoglobin 10.7 (*)    HCT 29.6 (*)    MCHC 36.1 (*)    All other components within normal limits  SODIUM - Abnormal; Notable for the following components:   Sodium 127 (*)    All other components within normal limits  COMPREHENSIVE METABOLIC PANEL - Abnormal; Notable for the following components:   Sodium 127 (*)    Potassium 3.1 (*)    Chloride 90 (*)    Calcium 8.7 (*)    Total Protein 5.4 (*)    Albumin 2.9 (*)    All other components within normal limits  CBC - Abnormal; Notable for the following components:   WBC 11.2 (*)    RBC 3.20 (*)    Hemoglobin 9.9 (*)    HCT 28.9 (*)    All other components within normal limits  BASIC METABOLIC PANEL - Abnormal; Notable for the following components:   Sodium 128 (*)    Chloride 92 (*)    Glucose, Bld 110 (*)    BUN 5 (*)    Calcium 8.5 (*)    All other components within normal limits  CBC - Abnormal; Notable for the following components:   WBC 13.2 (*)    RBC 3.15 (*)    Hemoglobin 9.7 (*)    HCT 28.9 (*)    All other components within normal limits  COMPREHENSIVE METABOLIC PANEL - Abnormal; Notable for the following components:   Sodium 129 (*)    Chloride 91 (*)    Glucose, Bld 131 (*)    BUN 5 (*)    Total Protein 5.6 (*)    Albumin 3.1 (*)    All other components within normal limits  CBC - Abnormal; Notable for the following components:   WBC 15.3 (*)    RBC 3.37 (*)    Hemoglobin 10.3 (*)    HCT 31.2 (*)    All other components within normal limits  CBC - Abnormal; Notable for the following components:   WBC 13.1 (*)    RBC 3.45 (*)    Hemoglobin 10.5 (*)    HCT 31.9 (*)    All other components within normal limits  COMPREHENSIVE METABOLIC PANEL - Abnormal; Notable for the following components:   Sodium 131 (*)     Chloride 96 (*)    Glucose, Bld 101 (*)    BUN 7 (*)    Calcium 8.6 (*)    Total Protein 5.0 (*)  Albumin 2.9 (*)    All other components within normal limits  POC OCCULT BLOOD, ED - Abnormal; Notable for the following components:   Fecal Occult Bld POSITIVE (*)    All other components within normal limits     Past Medical History:  Diagnosis Date   Anemia    bleeding ulcers no blood transfusions   Arthritis    Depression    GERD (gastroesophageal reflux disease)    Headache    sinus headaches   History of hiatal hernia    Hypertension    Hyperthyroidism    benign nodules in neck no meds    Surgical History:  She  has a past surgical history that includes Abdominal hysterectomy; Breast surgery; Back surgery; Joint replacement; knee arthroscopy; Foot surgery; and Total hip arthroplasty (Right, 04/27/2017). Family History:  Her family history is not on file. Social History:   reports that she has quit smoking. Her smoking use included cigarettes. She smoked an average of .5 packs per day. She has never used smokeless tobacco. She reports current alcohol use of about 1.0 standard drink of alcohol per week. She reports that she does not use drugs.  Prior to Admission medications   Medication Sig Start Date End Date Taking? Authorizing Provider  acetaminophen (TYLENOL) 500 MG tablet Take 500 mg by mouth every 6 (six) hours as needed for headache.   Yes [provider]  amLODipine (NORVASC) 5 MG tablet Take 5 mg by mouth daily. 11/05/15  Yes [provider]  Cholecalciferol (VITAMIN D) 2000 units CAPS Take 2,000 Units by mouth daily.   Yes [provider]  DULoxetine (CYMBALTA) 30 MG capsule Take 30 mg by mouth 2 (two) times daily. 05/16/21  Yes [provider]  esomeprazole (NEXIUM) 40 MG capsule Take 40 mg by mouth daily at 12 noon.   Yes [provider]  lisinopril-hydrochlorothiazide (ZESTORETIC) 10-12.5 MG tablet Take 1 tablet by  mouth daily. 04/23/21  Yes [provider]  ondansetron (ZOFRAN) 4 MG tablet Take 1 tablet (4 mg total) by mouth every 6 (six) hours as needed for nausea. 06/18/21  Yes Shelly Coss, MD  oxyCODONE-acetaminophen (PERCOCET) 10-325 MG tablet Take 1 tablet by mouth every 6 (six) hours as needed for pain. 04/30/17  Yes Mcarthur Rossetti, MD  RELISTOR 150 MG TABS Take 150 mg by mouth every morning.   Yes [provider]  simvastatin (ZOCOR) 10 MG tablet Take 10 mg by mouth every evening.  12/11/15  Yes [provider]  sucralfate (CARAFATE) 1 g tablet Take 1 g by mouth 4 (four) times daily -  with meals and at bedtime.   Yes [provider]  zolpidem (AMBIEN) 10 MG tablet Take 10 mg by mouth at bedtime as needed for sleep.   Yes [provider]    Current Facility-Administered Medications  Medication Dose Route Frequency Provider Last Rate Last Admin   acetaminophen (TYLENOL) tablet 650 mg  650 mg Oral Q6H PRN Cherene Altes, MD   650 mg at 12/19/22 1008   acidophilus (RISAQUAD) capsule 1 capsule  1 capsule Oral Daily Cherene Altes, MD   1 capsule at 12/22/22 0803   amLODipine (NORVASC) tablet 10 mg  10 mg Oral Daily Hunsucker, Bonna Gains, MD   10 mg at 12/22/22 0803   Chlorhexidine Gluconate Cloth 2 % PADS 6 each  6 each Topical Q0600 Hunsucker, Bonna Gains, MD   6 each at 12/22/22 0839   ciprofloxacin (CIPRO)  IVPB 200 mg  200 mg Intravenous Q12H Donne Hazel, MD       feeding supplement (ENSURE ENLIVE / ENSURE PLUS) liquid 237 mL  237 mL Oral BID BM Donne Hazel, MD   237 mL at 12/20/22 1346   HYDROmorphone (DILAUDID) injection 1 mg  1 mg Intravenous Q4H PRN Cherene Altes, MD   1 mg at 12/21/22 0224   metroNIDAZOLE (FLAGYL) IVPB 500 mg  500 mg Intravenous Q8H Donne Hazel, MD 100 mL/hr at 12/22/22 1432 500 mg at 12/22/22 1432   ondansetron (ZOFRAN) injection 4 mg  4 mg Intravenous Q6H PRN Hunsucker, Bonna Gains, MD   4 mg at 12/22/22  1325   Oral care mouth rinse  15 mL Mouth Rinse PRN Donne Hazel, MD       oxyCODONE (Oxy IR/ROXICODONE) immediate release tablet 10 mg  10 mg Oral Q4H PRN Cherene Altes, MD   10 mg at 12/22/22 1419   pantoprazole (PROTONIX) EC tablet 40 mg  40 mg Oral Daily Cherene Altes, MD   40 mg at 12/22/22 0805   polyethylene glycol (MIRALAX / GLYCOLAX) packet 17 g  17 g Oral Daily PRN Hunsucker, Bonna Gains, MD       prochlorperazine (COMPAZINE) injection 10 mg  10 mg Intravenous Q6H PRN Hunsucker, Bonna Gains, MD   10 mg at 12/18/22 2145   simvastatin (ZOCOR) tablet 10 mg  10 mg Oral q1800 Hunsucker, Bonna Gains, MD   10 mg at 12/21/22 2106    Allergies as of 12/17/2022 - Review Complete 12/17/2022  Allergen Reaction Noted   Fish allergy Anaphylaxis and Hives 06/18/2021   Adhesive [tape] Other (See Comments) 04/19/2017   Ampicillin Hives and Diarrhea 01/25/2016   Latex Other (See Comments) 04/19/2017    Review of Systems:    Constitutional: No weight loss, fever, chills, weakness or fatigue HEENT: Eyes: No change in vision               Ears, Nose, Throat:  No change in hearing or congestion Skin: No rash or itching Cardiovascular: No chest pain, chest pressure or palpitations   Respiratory: No SOB or cough Gastrointestinal: See HPI and otherwise negative Genitourinary: No dysuria or change in urinary frequency Neurological: No headache, dizziness or syncope Musculoskeletal: No new muscle or joint pain Hematologic: No bleeding or bruising Psychiatric: No history of depression or anxiety     Physical Exam:  Vital signs in last 24 hours: Temp:  [98.1 F (36.7 C)-98.9 F (37.2 C)] 98.2 F (36.8 C) (03/22 0803) Pulse Rate:  [68-97] 97 (03/22 0803) Resp:  [16-18] 16 (03/22 0803) BP: (151-193)/(76-91) 151/85 (03/22 0803) SpO2:  [90 %-100 %] 100 % (03/22 0803) Last BM Date : 12/19/22 Last BM recorded by nurses in past 5 days Stool Type: Type 7 (Liquid consistency with no solid  pieces) (12/19/2022  2:00 PM)  General:   Pleasant, well developed female in no acute distress Head:  Normocephalic and atraumatic. Eyes: sclerae anicteric,conjunctive pink  Heart:  regular rate and rhythm Pulm: Clear anteriorly; no wheezing Abdomen:  Distended, Obese AB, Active bowel sounds.  Mild to moderate tenderness in the entire abdomen.,  Worse epicgastric.  Without guarding and Without rebound, No organomegaly appreciated. Extremities:  Without edema. Msk:  Symmetrical without gross deformities. Peripheral pulses intact.  Neurologic:  Alert and  oriented x4;  No focal deficits.  Skin:   Dry and intact without significant lesions or rashes. Psychiatric:  Cooperative.  Normal mood and affect.  LAB RESULTS: Recent Labs    12/20/22 0205 12/21/22 0123 12/22/22 0314  WBC 13.2* 15.3* 13.1*  HGB 9.7* 10.3* 10.5*  HCT 28.9* 31.2* 31.9*  PLT 237 291 292   BMET Recent Labs    12/20/22 0205 12/21/22 0123 12/22/22 0314  NA 128* 129* 131*  K 3.8 4.4 3.5  CL 92* 91* 96*  CO2 29 31 28   GLUCOSE 110* 131* 101*  BUN 5* 5* 7*  CREATININE 0.77 0.72 0.74  CALCIUM 8.5* 8.9 8.6*   LFT Recent Labs    12/22/22 0314  PROT 5.0*  ALBUMIN 2.9*  AST 19  ALT 21  ALKPHOS 70  BILITOT 0.6   PT/INR No results for input(s): "LABPROT", "INR" in the last 72 hours.  STUDIES: CT ABDOMEN PELVIS W CONTRAST  Result Date: 12/22/2022 CLINICAL DATA:  Right lower quadrant pain.  GI bleeding. EXAM: CT ABDOMEN AND PELVIS WITH CONTRAST TECHNIQUE: Multidetector CT imaging of the abdomen and pelvis was performed using the standard protocol following bolus administration of intravenous contrast. RADIATION DOSE REDUCTION: This exam was performed according to the departmental dose-optimization program which includes automated exposure control, adjustment of the mA and/or kV according to patient size and/or use of iterative reconstruction technique. CONTRAST:  23mL OMNIPAQUE IOHEXOL 350 MG/ML SOLN COMPARISON:   12/17/2022 FINDINGS: Lower Chest: No acute findings. Hepatobiliary: No hepatic masses identified. Gallbladder is unremarkable. No evidence of biliary ductal dilatation. Pancreas:  No mass or inflammatory changes. Spleen: Within normal limits in size and appearance. Adrenals/Urinary Tract: No suspicious masses identified. No evidence of ureteral calculi or hydronephrosis. Stomach/Bowel: Increased moderate dilatation of fluid-filled small bowel loops is seen. There is gradual tapering to nondilated distal ileum, without evidence of a discrete transition point. This could be due to ileus, although partial small bowel obstruction cannot definitely be excluded. No mass process, or abnormal fluid collections identified. Normal appendix visualized. Vascular/Lymphatic: No pathologically enlarged lymph nodes. No acute vascular findings. Aortic atherosclerotic calcification incidentally noted. Reproductive: Prior hysterectomy noted. Adnexal regions are unremarkable in appearance. Other:  None. Musculoskeletal: No suspicious bone lesions identified. Bilateral hip prostheses noted with severe beam hardening artifact through the lower pelvis. IMPRESSION: Increased moderate small bowel dilatation, without discrete transition point. This could be due to ileus, although partial small bowel obstruction cannot definitely be excluded. No evidence of soft tissue mass, inflammatory process, or abscess. Aortic Atherosclerosis (ICD10-I70.0). Electronically Signed   By: Marlaine Hind M.D.   On: 12/22/2022 12:22   DG Abd 1 View  Result Date: 12/21/2022 CLINICAL DATA:  Ileus EXAM: ABDOMEN - 1 VIEW COMPARISON:  Partially imaged bilateral hip arthroplasties. FINDINGS: Nonspecific bowel gas pattern. Diffuse gaseous dilation of upper abdominal bowel loops, which appear to demonstrate haustral folds. No free air or pneumatosis. No abnormal radio-opaque calculi or mass effect. No acute or substantial osseous abnormality. The sacrum and coccyx  are partially obscured by overlying bowel contents. Partially imaged lung bases are clear. IMPRESSION: Diffuse gaseous dilation of colon, likely ileus although colonic obstruction is within the differential. Electronically Signed   By: Darrin Nipper M.D.   On: 12/21/2022 13:34     Vladimir Crofts  12/22/2022, 3:21 PM   Attending physician's note  I have taken a history, reviewed the chart and examined the patient. I performed a substantive portion of this encounter, including complete performance of at least one of the key components, in conjunction with the APP. I agree with the APP's note, impression and recommendations.  77 year old female admitted with enteritis/colitis  GI pathogen panel negative She has persistent mild leukocytosis, currently on antibiotics, plan to continue to complete 7-day course  She appears comfortable, abdomen is soft but distended and tympanic with no rebound or guarding She is passing flatus  Can hold off NG tube placement Bowel purge with MiraLAX Minimize narcotic use Ambulate out of bed as tolerated Replete electrolytes  Slowly advance diet as tolerated If has worsening abdominal distention, can consider IV Relistor, will need to be on monitor can cause transient bradycardia  Patient wants to go home soon, if her symptoms continue to improve she may possibly be able to be discharged home this weekend  The patient was provided an opportunity to ask questions and all were answered. The patient agreed with the plan and demonstrated an understanding of the instructions.  Damaris Hippo , MD [phone]

## 2022-12-23 ENCOUNTER — Inpatient Hospital Stay (HOSPITAL_COMMUNITY): Payer: Medicare Other

## 2022-12-23 DIAGNOSIS — K5901 Slow transit constipation: Secondary | ICD-10-CM | POA: Diagnosis not present

## 2022-12-23 DIAGNOSIS — R1084 Generalized abdominal pain: Secondary | ICD-10-CM | POA: Diagnosis not present

## 2022-12-23 DIAGNOSIS — K567 Ileus, unspecified: Secondary | ICD-10-CM | POA: Diagnosis not present

## 2022-12-23 DIAGNOSIS — E871 Hypo-osmolality and hyponatremia: Secondary | ICD-10-CM | POA: Diagnosis not present

## 2022-12-23 DIAGNOSIS — N179 Acute kidney failure, unspecified: Secondary | ICD-10-CM | POA: Diagnosis not present

## 2022-12-23 DIAGNOSIS — R935 Abnormal findings on diagnostic imaging of other abdominal regions, including retroperitoneum: Secondary | ICD-10-CM

## 2022-12-23 LAB — CBC WITH DIFFERENTIAL/PLATELET
Abs Immature Granulocytes: 0.64 10*3/uL — ABNORMAL HIGH (ref 0.00–0.07)
Basophils Absolute: 0.1 10*3/uL (ref 0.0–0.1)
Basophils Relative: 1 %
Eosinophils Absolute: 0.2 10*3/uL (ref 0.0–0.5)
Eosinophils Relative: 1 %
HCT: 31.9 % — ABNORMAL LOW (ref 36.0–46.0)
Hemoglobin: 10.6 g/dL — ABNORMAL LOW (ref 12.0–15.0)
Immature Granulocytes: 5 %
Lymphocytes Relative: 9 %
Lymphs Abs: 1.2 10*3/uL (ref 0.7–4.0)
MCH: 30.6 pg (ref 26.0–34.0)
MCHC: 33.2 g/dL (ref 30.0–36.0)
MCV: 92.2 fL (ref 80.0–100.0)
Monocytes Absolute: 1.8 10*3/uL — ABNORMAL HIGH (ref 0.1–1.0)
Monocytes Relative: 13 %
Neutro Abs: 10.1 10*3/uL — ABNORMAL HIGH (ref 1.7–7.7)
Neutrophils Relative %: 71 %
Platelets: 330 10*3/uL (ref 150–400)
RBC: 3.46 MIL/uL — ABNORMAL LOW (ref 3.87–5.11)
RDW: 15.2 % (ref 11.5–15.5)
WBC: 14 10*3/uL — ABNORMAL HIGH (ref 4.0–10.5)
nRBC: 0 % (ref 0.0–0.2)

## 2022-12-23 LAB — COMPREHENSIVE METABOLIC PANEL
ALT: 21 U/L (ref 0–44)
AST: 16 U/L (ref 15–41)
Albumin: 2.7 g/dL — ABNORMAL LOW (ref 3.5–5.0)
Alkaline Phosphatase: 60 U/L (ref 38–126)
Anion gap: 12 (ref 5–15)
BUN: 6 mg/dL — ABNORMAL LOW (ref 8–23)
CO2: 28 mmol/L (ref 22–32)
Calcium: 8.5 mg/dL — ABNORMAL LOW (ref 8.9–10.3)
Chloride: 91 mmol/L — ABNORMAL LOW (ref 98–111)
Creatinine, Ser: 0.74 mg/dL (ref 0.44–1.00)
GFR, Estimated: >60 mL/min (ref >60)
Glucose, Bld: 103 mg/dL — ABNORMAL HIGH (ref 70–99)
Potassium: 3.4 mmol/L — ABNORMAL LOW (ref 3.5–5.1)
Sodium: 131 mmol/L — ABNORMAL LOW (ref 135–145)
Total Bilirubin: 0.3 mg/dL (ref 0.3–1.2)
Total Protein: 4.8 g/dL — ABNORMAL LOW (ref 6.5–8.1)

## 2022-12-23 MED ORDER — METRONIDAZOLE 500 MG PO TABS: 500.0000 mg | ORAL_TABLET | Freq: Two times a day (BID) | ORAL | 0 refills | Status: AC

## 2022-12-23 MED ORDER — POLYETHYLENE GLYCOL 3350 17 G PO PACK: 17.0000 g | PACK | Freq: Every day | ORAL | 0 refills | Status: AC

## 2022-12-23 MED ORDER — CIPROFLOXACIN HCL 500 MG PO TABS: 500.0000 mg | ORAL_TABLET | Freq: Two times a day (BID) | ORAL | 0 refills | Status: AC

## 2022-12-23 MED ORDER — RISAQUAD PO CAPS: 1.0000 | ORAL_CAPSULE | Freq: Every day | ORAL | 0 refills | Status: AC

## 2022-12-23 MED ORDER — AMLODIPINE BESYLATE 10 MG PO TABS: 10.0000 mg | ORAL_TABLET | Freq: Every day | ORAL | 0 refills | Status: AC

## 2022-12-23 MED ORDER — POTASSIUM CHLORIDE CRYS ER 20 MEQ PO TBCR
60.0000 meq | EXTENDED_RELEASE_TABLET | Freq: Once | ORAL | Status: AC
  Administered 2022-12-23: 60 meq via ORAL
  Filled 2022-12-23: qty 3

## 2022-12-23 NOTE — Progress Notes (Signed)
[facility] ROUNDING NOTE   Subjective: Abdominal distention and discomfort improving s/p bowel purge with MiraLAX.   Objective: Vital signs in last 24 hours: Temp:  [98.3 F (36.8 C)-98.7 F (37.1 C)] 98.4 F (36.9 C) (03/23 0815) Pulse Rate:  [83-88] 88 (03/23 0815) Resp:  [15-18] 15 (03/23 0815) BP: (139-166)/(69-86) 139/69 (03/23 0843) SpO2:  [98 %-100 %] 100 % (03/23 0815) Last BM Date :  (12/23/22) General: NAD Abdomen: Soft, distended, nontender, tympanic with no rebound or guarding.     Intake/Output from previous day: 03/22 0701 - 03/23 0700 In: 1124.1 [P.O.:720; I.V.:4.6; IV Piggyback:399.5] Out: 0  Intake/Output this shift: No intake/output data recorded.   Lab Results: Recent Labs    12/21/22 0123 12/22/22 0314 12/23/22 0219  WBC 15.3* 13.1* 14.0*  HGB 10.3* 10.5* 10.6*  PLT 291 292 330  MCV 92.6 92.5 92.2   BMET Recent Labs    12/21/22 0123 12/22/22 0314 12/23/22 0219  NA 129* 131* 131*  K 4.4 3.5 3.4*  CL 91* 96* 91*  CO2 31 28 28   GLUCOSE 131* 101* 103*  BUN 5* 7* 6*  CREATININE 0.72 0.74 0.74  CALCIUM 8.9 8.6* 8.5*   LFT Recent Labs    12/21/22 0123 12/22/22 0314 12/23/22 0219  PROT 5.6* 5.0* 4.8*  ALBUMIN 3.1* 2.9* 2.7*  AST 21 19 16   ALT 23 21 21   ALKPHOS 69 70 60  BILITOT 0.3 0.6 0.3   PT/INR No results for input(s): "INR" in the last 72 hours.    Imaging/Other results: DG Abd Portable 1V  Result Date: 12/23/2022 CLINICAL DATA:  9749 with ileus and small bowel dilatation. EXAM: PORTABLE ABDOMEN - 1 VIEW COMPARISON:  CT with IV and oral contrast yesterday at 11:03 a.m. FINDINGS: 5:12 a.m. enteric contrast has transitted from the small bowel and is seen faintly in the ascending and proximal transverse colon. There is colonic gas through into the rectum. Generalized small bowel dilatation up to 4.3 cm continues to be seen and seems unchanged. Findings could be due to an ileus or a low-grade distal small bowel  obstruction. There is no supine evidence for free air. 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 IMPRESSION: 1. Generalized small bowel dilatation up to 4.3 cm continues to be seen and seems unchanged. Findings could be due to an ileus or a low-grade distal small bowel obstruction. 2. Enteric contrast has transitted into the ascending and proximal transverse colon. Colonic gas is seen through to the rectum. 3. Aortoiliac calcific disease. Electronically Signed   By: Telford Nab M.D.   On: 12/23/2022 07:28   CT ABDOMEN PELVIS W CONTRAST  Result Date: 12/22/2022 CLINICAL DATA:  Right lower quadrant pain.  GI bleeding. EXAM: CT ABDOMEN AND PELVIS WITH CONTRAST TECHNIQUE: Multidetector CT imaging of the abdomen and pelvis was performed using the standard protocol following bolus administration of intravenous contrast. RADIATION DOSE REDUCTION: This exam was performed according to the departmental dose-optimization program which includes automated exposure control, adjustment of the mA and/or kV according to patient size and/or use of iterative reconstruction technique. CONTRAST:  80mL OMNIPAQUE IOHEXOL 350 MG/ML SOLN COMPARISON:  12/17/2022 FINDINGS: Lower Chest: No acute findings. Hepatobiliary: No hepatic masses identified. Gallbladder is unremarkable. No evidence of biliary ductal dilatation. Pancreas:  No mass or inflammatory changes. Spleen: Within normal limits in size and appearance. Adrenals/Urinary Tract: No suspicious masses identified. No evidence of ureteral calculi or hydronephrosis. Stomach/Bowel: Increased moderate dilatation of fluid-filled small bowel loops is seen.  There is gradual tapering to nondilated distal ileum, without evidence of a discrete transition point. This could be due to ileus, although partial small bowel obstruction cannot definitely be excluded. No mass process, or abnormal fluid collections identified. Normal appendix visualized. Vascular/Lymphatic: No  pathologically enlarged lymph nodes. No acute vascular findings. Aortic atherosclerotic calcification incidentally noted. Reproductive: Prior hysterectomy noted. Adnexal regions are unremarkable in appearance. Other:  None. Musculoskeletal: No suspicious bone lesions identified. Bilateral hip prostheses noted with severe beam hardening artifact through the lower pelvis. IMPRESSION: Increased moderate small bowel dilatation, without discrete transition point. This could be due to ileus, although partial small bowel obstruction cannot definitely be excluded. No evidence of soft tissue mass, inflammatory process, or abscess. Aortic Atherosclerosis (ICD10-I70.0). Electronically Signed   By: [place]aine Hind M.D.   On: 12/22/2022 12:22   DG Abd 1 View  Result Date: 12/21/2022 CLINICAL DATA:  Ileus EXAM: ABDOMEN - 1 VIEW COMPARISON:  Partially imaged bilateral hip arthroplasties. FINDINGS: Nonspecific bowel gas pattern. Diffuse gaseous dilation of upper abdominal bowel loops, which appear to demonstrate haustral folds. No free air or pneumatosis. No abnormal radio-opaque calculi or mass effect. No acute or substantial osseous abnormality. The sacrum and coccyx are partially obscured by overlying bowel contents. Partially imaged lung bases are clear. IMPRESSION: Diffuse gaseous dilation of colon, likely ileus although colonic obstruction is within the differential. Electronically Signed   By: Darrin Nipper M.D.   On: 12/21/2022 13:34      Assessment &Plan  77 year old female with enteritis/colitis with nausea, vomiting, generalized abdominal pain Ileus is improving Advance diet as tolerated and continue daily MiraLAX 1 capful to prevent recurrent constipation Okay to discharge home from GI standpoint  Follow-up with outpatient GI at [place] signing off, please call with any questions   K. Denzil Magnuson , MD [phone]  [facility]

## 2022-12-23 NOTE — Discharge Summary (Addendum)
Physician Discharge Summary   Patient: Kaitlin Little MRN: [id number] DOB: [birth year]  Admit date:     12/17/2022  Discharge date: 12/23/22  Discharge Physician: Marylu Lund   PCP: Sandi Mariscal, MD   Recommendations at discharge:    Follow up with PCP in 1-2 weeks Follow up with primary GI doctor at [facility]   Discharge Diagnoses: Principal Problem:   GI bleed  Resolved Problems:   * No resolved hospital problems. *  Hospital Course: 77 year old with a history of PUD, HTN, and HLD who was brought to the ER with generalized abdominal pain nausea vomiting and dark stools for 3 days. At presentation she was found to be suffering with acute kidney injury, hyponatremia, and severe dehydration. She was diagnosed with a viral gastroenteritis   Assessment and Plan: Acute gastroenteritis, ileitis, ileus vs SBO GI viral panel unremarkable -Pt was given short trial of Imodium with resolution of diarrhea -Presenting CT with findings compatible with infectious/inflammatory colitis and ileitis.  -WBC had been slowly trending up and pt complained of continued LLQ pains, prompting empiric PO cipro and flagyl -Patient developed increasing abd distension and continued abd pain, prompting repeat CT with findings concerning for ileus vs SBO, tapering to the distal ileum with no definite transition zone -Consulted GI -Pt was changed abx to cipro/flagyl. Pt was given scheduled miralax per GI with good results. Per GI, rec to continue daily miralax with goal of 2-3 BM daily -Pt to complete course of cipro/flagyl on d/c   Hyponatremia with severe dehydration Due to a combination of GI loss as well as use of HCTZ - sodium improved with simple volume expansion   Acute kidney injury due to ischemic ATN in setting of severe dehydration This has improved with volume resuscitation     Hypokalemia Replaced   Mild hematochezia - history of PUD Hemoglobin has been stable -felt to simply be related to profuse  watery diarrhea -no indication for acute endoscopy presently  -Hgb remained stable   HTN Blood pressure presently controlled   HLD Continue Zocor   Chronic pain syndrome/arthritis     Consultants: GI Procedures performed:   Disposition: Home Diet recommendation:  Regular diet DISCHARGE MEDICATION: Allergies as of 12/23/2022       Reactions   Fish Allergy Anaphylaxis, Hives   Only shrimp   Adhesive [tape] Other (See Comments)   Skin tears/redness. Cloth/paper tape tolerates   Ampicillin Hives, Diarrhea   TOLERATES ORALLY NOT INTRAVENOUS  Has patient had a PCN reaction causing immediate rash, facial/tongue/throat swelling, SOB or lightheadedness with hypotension:No Has patient had a PCN reaction causing severe rash involving mucus membranes or skin necrosis:Unknown Has patient had a PCN reaction that required hospitalization:Inpatient when reaction occurred. Has patient had a PCN reaction occurring within the last 10 years: No If all of the above answers are "NO", then may proceed with Cephalosporin   Latex Other (See Comments)   Skin redness/peels skin        Medication List     STOP taking these medications    lisinopril-hydrochlorothiazide 10-12.5 MG tablet Commonly known as: ZESTORETIC       TAKE these medications    acetaminophen 500 MG tablet Commonly known as: TYLENOL Take 500 mg by mouth every 6 (six) hours as needed for headache.   acidophilus Caps capsule Take 1 capsule by mouth daily. Start taking on: December 24, 2022   amLODipine 10 MG tablet Commonly known as: NORVASC Take 1 tablet (10 mg total) by mouth daily.  Start taking on: December 24, 2022 What changed:  medication strength how much to take   ciprofloxacin 500 MG tablet Commonly known as: CIPRO Take 1 tablet (500 mg total) by mouth 2 (two) times daily for 5 days.   DULoxetine 30 MG capsule Commonly known as: CYMBALTA Take 30 mg by mouth 2 (two) times daily.   esomeprazole 40 MG  capsule Commonly known as: NEXIUM Take 40 mg by mouth daily at 12 noon.   metroNIDAZOLE 500 MG tablet Commonly known as: Flagyl Take 1 tablet (500 mg total) by mouth 2 (two) times daily for 5 days.   ondansetron 4 MG tablet Commonly known as: ZOFRAN Take 1 tablet (4 mg total) by mouth every 6 (six) hours as needed for nausea.   oxyCODONE-acetaminophen 10-325 MG tablet Commonly known as: PERCOCET Take 1 tablet by mouth every 6 (six) hours as needed for pain.   polyethylene glycol 17 g packet Commonly known as: MIRALAX / GLYCOLAX Take 17 g by mouth daily.   Relistor 150 MG Tabs Generic drug: Methylnaltrexone Bromide Take 150 mg by mouth every morning.   simvastatin 10 MG tablet Commonly known as: ZOCOR Take 10 mg by mouth every evening.   sucralfate 1 g tablet Commonly known as: CARAFATE Take 1 g by mouth 4 (four) times daily -  with meals and at bedtime.   Vitamin D 50 MCG (2000 UT) Caps Take 2,000 Units by mouth daily.   zolpidem 10 MG tablet Commonly known as: AMBIEN Take 10 mg by mouth at bedtime as needed for sleep.        Follow-up Information     Sandi Mariscal, MD Follow up in 2 week(s).   Specialty: Internal Medicine Why: Hospital follow up Contact information: [place] [address] [phone]         Follow up with your GI doctor at [facility]. Schedule an appointment as soon as possible for a visit.                 Discharge Exam: Filed Weights   12/18/22 0500 12/18/22 2100 12/19/22 0714  Weight: 57.4 kg 56.9 kg 54.9 kg   General exam: Awake, laying in bed, in nad Respiratory system: Normal respiratory effort, no wheezing Cardiovascular system: regular rate, s1, s2 Gastrointestinal system: Soft, nondistended, positive BS Central nervous system: CN2-12 grossly intact, strength intact Extremities: Perfused, no clubbing Skin: Normal skin turgor, no notable skin lesions seen Psychiatry: Mood normal // no  visual hallucinations   Condition at discharge: fair  The results of significant diagnostics from this hospitalization (including imaging, microbiology, ancillary and laboratory) are listed below for reference.   Imaging Studies: DG Abd Portable 1V  Result Date: 12/23/2022 CLINICAL DATA:  9749 with ileus and small bowel dilatation. EXAM: PORTABLE ABDOMEN - 1 VIEW COMPARISON:  CT with IV and oral contrast yesterday at 11:03 a.m. FINDINGS: 5:12 a.m. enteric contrast has transitted from the small bowel and is seen faintly in the ascending and proximal transverse colon. There is colonic gas through into the rectum. Generalized small bowel dilatation up to 4.3 cm continues to be seen and seems unchanged. Findings could be due to an ileus or a low-grade distal small bowel obstruction. There is no supine evidence for free air. 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 IMPRESSION: 1. Generalized small bowel dilatation up to 4.3 cm continues to be seen and seems unchanged. Findings could be due to an ileus or a low-grade distal small bowel  obstruction. 2. Enteric contrast has transitted into the ascending and proximal transverse colon. Colonic gas is seen through to the rectum. 3. Aortoiliac calcific disease. Electronically Signed   By: Telford Nab M.D.   On: 12/23/2022 07:28   CT ABDOMEN PELVIS W CONTRAST  Result Date: 12/22/2022 CLINICAL DATA:  Right lower quadrant pain.  GI bleeding. EXAM: CT ABDOMEN AND PELVIS WITH CONTRAST TECHNIQUE: Multidetector CT imaging of the abdomen and pelvis was performed using the standard protocol following bolus administration of intravenous contrast. RADIATION DOSE REDUCTION: This exam was performed according to the departmental dose-optimization program which includes automated exposure control, adjustment of the mA and/or kV according to patient size and/or use of iterative reconstruction technique. CONTRAST:  44mL OMNIPAQUE IOHEXOL 350 MG/ML SOLN  COMPARISON:  12/17/2022 FINDINGS: Lower Chest: No acute findings. Hepatobiliary: No hepatic masses identified. Gallbladder is unremarkable. No evidence of biliary ductal dilatation. Pancreas:  No mass or inflammatory changes. Spleen: Within normal limits in size and appearance. Adrenals/Urinary Tract: No suspicious masses identified. No evidence of ureteral calculi or hydronephrosis. Stomach/Bowel: Increased moderate dilatation of fluid-filled small bowel loops is seen. There is gradual tapering to nondilated distal ileum, without evidence of a discrete transition point. This could be due to ileus, although partial small bowel obstruction cannot definitely be excluded. No mass process, or abnormal fluid collections identified. Normal appendix visualized. Vascular/Lymphatic: No pathologically enlarged lymph nodes. No acute vascular findings. Aortic atherosclerotic calcification incidentally noted. Reproductive: Prior hysterectomy noted. Adnexal regions are unremarkable in appearance. Other:  None. Musculoskeletal: No suspicious bone lesions identified. Bilateral hip prostheses noted with severe beam hardening artifact through the lower pelvis. IMPRESSION: Increased moderate small bowel dilatation, without discrete transition point. This could be due to ileus, although partial small bowel obstruction cannot definitely be excluded. No evidence of soft tissue mass, inflammatory process, or abscess. Aortic Atherosclerosis (ICD10-I70.0). Electronically Signed   By: [place]aine Hind M.D.   On: 12/22/2022 12:22   DG Abd 1 View  Result Date: 12/21/2022 CLINICAL DATA:  Ileus EXAM: ABDOMEN - 1 VIEW COMPARISON:  Partially imaged bilateral hip arthroplasties. FINDINGS: Nonspecific bowel gas pattern. Diffuse gaseous dilation of upper abdominal bowel loops, which appear to demonstrate haustral folds. No free air or pneumatosis. No abnormal radio-opaque calculi or mass effect. No acute or substantial osseous abnormality. The sacrum  and coccyx are partially obscured by overlying bowel contents. Partially imaged lung bases are clear. IMPRESSION: Diffuse gaseous dilation of colon, likely ileus although colonic obstruction is within the differential. Electronically Signed   By: Darrin Nipper M.D.   On: 12/21/2022 13:34   CT ABDOMEN PELVIS WO CONTRAST  Result Date: 12/17/2022 CLINICAL DATA:  Abdominal pain, acute, nonlocalized Diverticulitis, complication suspected Lower GI bleed EXAM: CT ABDOMEN AND PELVIS WITHOUT CONTRAST TECHNIQUE: Multidetector CT imaging of the abdomen and pelvis was performed following the standard protocol without IV contrast. RADIATION DOSE REDUCTION: This exam was performed according to the departmental dose-optimization program which includes automated exposure control, adjustment of the mA and/or kV according to patient size and/or use of iterative reconstruction technique. COMPARISON:  CT 06/17/2021, MRI 10/01/2022 FINDINGS: Lower chest: No acute abnormality. Hepatobiliary: No focal liver abnormality is seen. Unchanged ectasia of the intra and extra hepatic biliary ducts. The gallbladder is unremarkable. Pancreas: Unremarkable. No pancreatic ductal dilatation or surrounding inflammatory changes. Spleen: Normal in size without focal abnormality. Adrenals/Urinary Tract: Adrenal glands are unremarkable. No hydronephrosis or nephrolithiasis. The bladder is moderately distended. Stomach/Bowel: There is submucosal edema and mild adjacent stranding  involving the rectosigmoid colon, descending colon hepatic flexure, and distal transverse colon. There is an air-fluid level in the cecum. There are multiple prominent loops of fluid-filled distal ileum with wall thickening common distended through to the rectum, without a transition point. Decompressed proximal small bowel. There is distension of the stomach. Mild small bowel mesenteric edema. Vascular/Lymphatic: Aortoiliac atherosclerosis.  No lymphadenopathy. Reproductive:  Obscured by streak artifact from hip arthroplasties. Other: No free air.  No focal fluid collection. Musculoskeletal: No acute osseous abnormality. No suspicious osseous lesion. Multilevel degenerative changes of the spine, severe at L5-S1. Prior bilateral hip arthroplasties. IMPRESSION: Findings are compatible with infectious/inflammatory colitis and ileitis. No focal fluid collection or free intraperitoneal gas. Unchanged ectasia of the intra and extrahepatic biliary ducts. Electronically Signed   By: Maurine Simmering M.D.   On: 12/17/2022 17:25    Microbiology: Results for orders placed or performed during the hospital encounter of 12/17/22  MRSA Next Gen by PCR, Nasal     Status: None   Collection Time: 12/17/22  7:00 PM   Specimen: Nasal Mucosa; Nasal Swab  Result Value Ref Range Status   MRSA by PCR Next Gen NOT DETECTED NOT DETECTED Final    Comment: (NOTE) The GeneXpert MRSA Assay (FDA approved for NASAL specimens only), is one component of a comprehensive MRSA colonization surveillance program. It is not intended to diagnose MRSA infection nor to guide or monitor treatment for MRSA infections. Test performance is not FDA approved in patients less than 74 years old. Performed at [facility], [place] [address] [place], [place] [address]   Gastrointestinal Panel by PCR , Stool     Status: None   Collection Time: 12/18/22  5:40 PM   Specimen: Stool  Result Value Ref Range Status   Campylobacter species NOT DETECTED NOT DETECTED Final   Plesimonas shigelloides NOT DETECTED NOT DETECTED Final   Salmonella species NOT DETECTED NOT DETECTED Final   Yersinia enterocolitica NOT DETECTED NOT DETECTED Final   Vibrio species NOT DETECTED NOT DETECTED Final   Vibrio cholerae NOT DETECTED NOT DETECTED Final   Enteroaggregative E coli (EAEC) NOT DETECTED NOT DETECTED Final   Enteropathogenic E coli (EPEC) NOT DETECTED NOT DETECTED Final   Enterotoxigenic E coli (ETEC) NOT DETECTED NOT DETECTED  Final   Shiga like toxin producing E coli (STEC) NOT DETECTED NOT DETECTED Final   Shigella/Enteroinvasive E coli (EIEC) NOT DETECTED NOT DETECTED Final   Cryptosporidium NOT DETECTED NOT DETECTED Final   Cyclospora cayetanensis NOT DETECTED NOT DETECTED Final   Entamoeba histolytica NOT DETECTED NOT DETECTED Final   Giardia lamblia NOT DETECTED NOT DETECTED Final   Adenovirus F40/41 NOT DETECTED NOT DETECTED Final   Astrovirus NOT DETECTED NOT DETECTED Final   Norovirus GI/GII NOT DETECTED NOT DETECTED Final   Rotavirus A NOT DETECTED NOT DETECTED Final   Sapovirus (I, II, IV, and V) NOT DETECTED NOT DETECTED Final    Comment: Performed at [facility], [address] [place], [place] [address]    Labs: CBC: Recent Labs  Lab 12/19/22 0922 12/20/22 0205 12/21/22 0123 12/22/22 0314 12/23/22 0219  WBC 11.2* 13.2* 15.3* 13.1* 14.0*  NEUTROABS  --   --   --   --  10.1*  HGB 9.9* 9.7* 10.3* 10.5* 10.6*  HCT 28.9* 28.9* 31.2* 31.9* 31.9*  MCV 90.3 91.7 92.6 92.5 92.2  PLT 218 237 291 292 [id number]   Basic Metabolic Panel: Recent Labs  Lab 12/17/22 1707 12/17/22 2038 12/18/22  0600 12/18/22 1219 12/19/22 0922 12/20/22 0205 12/21/22 0123 12/22/22 0314 12/22/22 0335 12/23/22 0219  NA  --    < > 125*   < > 127* 128* 129* 131*  --  131*  K  --   --  3.2*  --  3.1* 3.8 4.4 3.5  --  3.4*  CL  --   --  87*  --  90* 92* 91* 96*  --  91*  CO2  --   --  23  --  26 29 31 28   --  28  GLUCOSE  --   --  107*  --  88 110* 131* 101*  --  103*  BUN  --   --  31*  --  15 5* 5* 7*  --  6*  CREATININE  --   --  1.59*  --  0.87 0.77 0.72 0.74  --  0.74  CALCIUM  --   --  8.4*  --  8.7* 8.5* 8.9 8.6*  --  8.5*  MG 1.8  --  1.7  --  1.9  --   --   --  1.7  --   PHOS 5.6*  --  4.2  --   --   --   --   --   --   --    < > = values in this interval not displayed.   Liver Function Tests: Recent Labs  Lab 12/17/22 1422 12/19/22 0922 12/21/22 0123 12/22/22 0314 12/23/22 0219  AST 47*  32 21 19 16   ALT 20 22 23 21 21   ALKPHOS 75 53 69 70 60  BILITOT 0.4 0.3 0.3 0.6 0.3  PROT 7.2 5.4* 5.6* 5.0* 4.8*  ALBUMIN 4.5 2.9* 3.1* 2.9* 2.7*   CBG: Recent Labs  Lab 12/17/22 1950  GLUCAP 114*    Discharge time spent: less than 30 minutes.  Signed: Marylu Lund, MD [facility] 12/23/2022

## 2022-12-23 NOTE — Plan of Care (Signed)
  Problem: Clinical Measurements: Goal: Will remain free from infection Outcome: Adequate for Discharge   Problem: Pain Managment: Goal: General experience of comfort will improve Outcome: Adequate for Discharge   

## 2023-05-16 ENCOUNTER — Other Ambulatory Visit: Payer: Self-pay | Admitting: Nurse Practitioner

## 2023-05-16 DIAGNOSIS — M47816 Spondylosis without myelopathy or radiculopathy, lumbar region: Secondary | ICD-10-CM

## 2023-06-16 ENCOUNTER — Ambulatory Visit
Admission: RE | Admit: 2023-06-16 | Discharge: 2023-06-16 | Disposition: A | Discharge status: Home / Self Care | Payer: Medicare Other | Source: Ambulatory Visit | Attending: Nurse Practitioner | Admitting: Nurse Practitioner

## 2023-06-16 DIAGNOSIS — M47816 Spondylosis without myelopathy or radiculopathy, lumbar region: Secondary | ICD-10-CM
